# Patient Record
Sex: Female | Born: 1985 | Race: White | Hispanic: Yes | Marital: Married | Smoking: Never smoker
Health system: Southern US, Community
[De-identification: ages and names within clinical notes are randomized; demographics above are authoritative.]

## PROBLEM LIST (undated history)

## (undated) ENCOUNTER — Inpatient Hospital Stay (HOSPITAL_COMMUNITY): Payer: Self-pay

## (undated) DIAGNOSIS — F329 Major depressive disorder, single episode, unspecified: Secondary | ICD-10-CM

## (undated) DIAGNOSIS — F32A Depression, unspecified: Secondary | ICD-10-CM

## (undated) DIAGNOSIS — R87629 Unspecified abnormal cytological findings in specimens from vagina: Secondary | ICD-10-CM

## (undated) DIAGNOSIS — IMO0002 Reserved for concepts with insufficient information to code with codable children: Secondary | ICD-10-CM

## (undated) DIAGNOSIS — R87619 Unspecified abnormal cytological findings in specimens from cervix uteri: Secondary | ICD-10-CM

## (undated) HISTORY — DX: Depression, unspecified: F32.A

## (undated) HISTORY — PX: NO PAST SURGERIES: SHX2092

## (undated) HISTORY — DX: Reserved for concepts with insufficient information to code with codable children: IMO0002

## (undated) HISTORY — DX: Unspecified abnormal cytological findings in specimens from vagina: R87.629

## (undated) HISTORY — PX: COLPOSCOPY: SHX161

## (undated) HISTORY — DX: Major depressive disorder, single episode, unspecified: F32.9

## (undated) HISTORY — DX: Unspecified abnormal cytological findings in specimens from cervix uteri: R87.619

---
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
## 2007-12-31 ENCOUNTER — Emergency Department (HOSPITAL_COMMUNITY): Admission: EM | Admit: 2007-12-31 | Discharge: 2007-12-31 | Payer: Self-pay | Admitting: Family Medicine

## 2008-03-10 ENCOUNTER — Inpatient Hospital Stay (HOSPITAL_COMMUNITY): Admission: AD | Admit: 2008-03-10 | Discharge: 2008-03-10 | Payer: Self-pay | Admitting: Obstetrics & Gynecology

## 2008-05-07 ENCOUNTER — Ambulatory Visit (HOSPITAL_COMMUNITY): Admission: RE | Admit: 2008-05-07 | Discharge: 2008-05-07 | Payer: Self-pay | Admitting: Obstetrics & Gynecology

## 2008-10-10 ENCOUNTER — Ambulatory Visit (HOSPITAL_COMMUNITY): Admission: RE | Admit: 2008-10-10 | Discharge: 2008-10-10 | Payer: Self-pay | Admitting: Family Medicine

## 2008-10-11 ENCOUNTER — Inpatient Hospital Stay (HOSPITAL_COMMUNITY): Admission: AD | Admit: 2008-10-11 | Discharge: 2008-10-11 | Payer: Self-pay | Admitting: Obstetrics and Gynecology

## 2008-10-11 ENCOUNTER — Inpatient Hospital Stay (HOSPITAL_COMMUNITY): Admission: AD | Admit: 2008-10-11 | Discharge: 2008-10-13 | Payer: Self-pay | Admitting: Obstetrics and Gynecology

## 2008-10-11 ENCOUNTER — Ambulatory Visit: Payer: Self-pay | Admitting: Obstetrics and Gynecology

## 2010-06-21 ENCOUNTER — Emergency Department (HOSPITAL_COMMUNITY): Admission: EM | Admit: 2010-06-21 | Discharge: 2010-06-21 | Payer: Self-pay | Admitting: Emergency Medicine

## 2011-01-28 LAB — URINALYSIS, ROUTINE W REFLEX MICROSCOPIC
Glucose, UA: NEGATIVE mg/dL
Ketones, ur: NEGATIVE mg/dL
Protein, ur: NEGATIVE mg/dL
Specific Gravity, Urine: 1.015 (ref 1.005–1.030)
pH: 8 (ref 5.0–8.0)

## 2011-01-28 LAB — URINE MICROSCOPIC-ADD ON

## 2011-01-28 LAB — POCT I-STAT, CHEM 8
Chloride: 106 mEq/L (ref 96–112)
Glucose, Bld: 89 mg/dL (ref 70–99)
HCT: 39 % (ref 36.0–46.0)
Hemoglobin: 13.3 g/dL (ref 12.0–15.0)
Sodium: 140 mEq/L (ref 135–145)

## 2011-08-09 LAB — URINE MICROSCOPIC-ADD ON

## 2011-08-09 LAB — WET PREP, GENITAL: Trich, Wet Prep: NONE SEEN

## 2011-08-09 LAB — POCT PREGNANCY, URINE: Operator id: 222261

## 2011-08-09 LAB — URINALYSIS, ROUTINE W REFLEX MICROSCOPIC
Bilirubin Urine: NEGATIVE
Ketones, ur: 15 — AB

## 2011-08-09 LAB — URINE CULTURE

## 2011-08-16 LAB — CBC
Hemoglobin: 12.6 g/dL (ref 12.0–15.0)
MCHC: 33.9 g/dL (ref 30.0–36.0)
RBC: 4.31 MIL/uL (ref 3.87–5.11)
WBC: 8.4 10*3/uL (ref 4.0–10.5)

## 2011-08-16 LAB — RPR: RPR Ser Ql: NONREACTIVE

## 2012-07-11 ENCOUNTER — Ambulatory Visit (INDEPENDENT_AMBULATORY_CARE_PROVIDER_SITE_OTHER): Payer: Self-pay | Admitting: Obstetrics and Gynecology

## 2012-07-11 ENCOUNTER — Encounter: Payer: Self-pay | Admitting: Obstetrics and Gynecology

## 2012-07-11 VITALS — BP 97/68 | HR 64 | Temp 98.3°F | Ht 60.0 in | Wt 119.4 lb

## 2012-07-11 DIAGNOSIS — R87613 High grade squamous intraepithelial lesion on cytologic smear of cervix (HGSIL): Secondary | ICD-10-CM | POA: Insufficient documentation

## 2012-07-11 NOTE — Progress Notes (Signed)
Patient ID: Anna Cowan, female   DOB: July 29, 1986, 26 y.o.   MRN: [id number] Patient referred from health department for further management of abnormal pap smear. 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 Patient counseled on the need to repeat pap smear with co-testing at 12 and 24 months vs excisional biopsy according to ASCCP guidelines. Patient opted for repeat pap smear. Patient very hesitant regarding excisional biopsy. Procedure explained including its risks and benefits. All questions were answered. Patient still desires to have a repeat pap smear.   Patient understands that it is important to follow-up for these appointments as to prevent progression to cervical cancer.

## 2013-04-02 LAB — OB RESULTS CONSOLE HEPATITIS B SURFACE ANTIGEN: Hepatitis B Surface Ag: NEGATIVE

## 2013-04-02 LAB — OB RESULTS CONSOLE HIV ANTIBODY (ROUTINE TESTING): HIV: NONREACTIVE

## 2013-04-02 LAB — OB RESULTS CONSOLE RPR: RPR: NONREACTIVE

## 2013-04-02 LAB — OB RESULTS CONSOLE GC/CHLAMYDIA
Chlamydia: NEGATIVE
Gonorrhea: NEGATIVE

## 2013-04-02 LAB — OB RESULTS CONSOLE RUBELLA ANTIBODY, IGM: Rubella: IMMUNE

## 2013-08-18 ENCOUNTER — Encounter (HOSPITAL_COMMUNITY): Payer: Self-pay | Admitting: *Deleted

## 2013-08-18 ENCOUNTER — Inpatient Hospital Stay (HOSPITAL_COMMUNITY)
Admission: AD | Admit: 2013-08-18 | Discharge: 2013-08-20 | DRG: 373 | Disposition: A | Discharge status: Home / Self Care | Payer: BC Managed Care – PPO | Source: Ambulatory Visit | Attending: Obstetrics and Gynecology | Admitting: Obstetrics and Gynecology

## 2013-08-18 LAB — TYPE AND SCREEN: Antibody Screen: NEGATIVE

## 2013-08-18 LAB — CBC
Hemoglobin: 11.1 g/dL — ABNORMAL LOW (ref 12.0–15.0)
MCH: 26.9 pg (ref 26.0–34.0)
MCHC: 33.3 g/dL (ref 30.0–36.0)
MCV: 80.8 fL (ref 78.0–100.0)
Platelets: 181 10*3/uL (ref 150–400)
RBC: 4.12 MIL/uL (ref 3.87–5.11)

## 2013-08-18 MED ORDER — OXYCODONE-ACETAMINOPHEN 5-325 MG PO TABS: 1.0000 | ORAL_TABLET | ORAL | Status: DC | PRN

## 2013-08-18 MED ORDER — IBUPROFEN 600 MG PO TABS: 600.0000 mg | ORAL_TABLET | Freq: Four times a day (QID) | ORAL | Status: DC | PRN

## 2013-08-18 MED ORDER — FLEET ENEMA 7-19 GM/118ML RE ENEM: 1.0000 | ENEMA | RECTAL | Status: DC | PRN

## 2013-08-18 MED ORDER — ACETAMINOPHEN 325 MG PO TABS: 650.0000 mg | ORAL_TABLET | ORAL | Status: DC | PRN

## 2013-08-18 MED ORDER — LIDOCAINE HCL (PF) 1 % IJ SOLN
30.0000 mL | INTRAMUSCULAR | Status: DC | PRN
  Filled 2013-08-18 (×2): qty 30

## 2013-08-18 MED ORDER — TERBUTALINE SULFATE 1 MG/ML IJ SOLN: 0.2500 mg | Freq: Once | INTRAMUSCULAR | Status: AC | PRN

## 2013-08-18 MED ORDER — LACTATED RINGERS IV SOLN
INTRAVENOUS | Status: DC
  Administered 2013-08-18 – 2013-08-19 (×3): via INTRAVENOUS

## 2013-08-18 MED ORDER — OXYTOCIN 40 UNITS IN LACTATED RINGERS INFUSION - SIMPLE MED
1.0000 m[IU]/min | INTRAVENOUS | Status: DC
  Administered 2013-08-18: 1 m[IU]/min via INTRAVENOUS
  Filled 2013-08-18: qty 1000

## 2013-08-18 MED ORDER — OXYTOCIN 40 UNITS IN LACTATED RINGERS INFUSION - SIMPLE MED
62.5000 mL/h | INTRAVENOUS | Status: DC
  Administered 2013-08-19: 62.5 mL/h via INTRAVENOUS

## 2013-08-18 MED ORDER — CITRIC ACID-SODIUM CITRATE 334-500 MG/5ML PO SOLN: 30.0000 mL | ORAL | Status: DC | PRN

## 2013-08-18 MED ORDER — BUTORPHANOL TARTRATE 1 MG/ML IJ SOLN: 1.0000 mg | INTRAMUSCULAR | Status: DC | PRN

## 2013-08-18 MED ORDER — LACTATED RINGERS IV SOLN
500.0000 mL | INTRAVENOUS | Status: DC | PRN
  Administered 2013-08-19: 500 mL via INTRAVENOUS

## 2013-08-18 MED ORDER — OXYTOCIN BOLUS FROM INFUSION
500.0000 mL | INTRAVENOUS | Status: DC
  Administered 2013-08-19: 500 mL via INTRAVENOUS

## 2013-08-18 MED ORDER — ONDANSETRON HCL 4 MG/2ML IJ SOLN: 4.0000 mg | Freq: Four times a day (QID) | INTRAMUSCULAR | Status: DC | PRN

## 2013-08-18 NOTE — MAU Note (Signed)
Pt presents with complaints of a large gush of fluid yellowish in color around 430pm today. Denies any vaginal bleeding or contractions. States baby is active.

## 2013-08-18 NOTE — MAU Provider Note (Signed)
HPI:  Ms. Anna Cowan is a 27 y.o. female; G3P2002 at [redacted]w[redacted]d who presents with possible ROM. She felt a large gush of clear fluid around 1630. She is GBS negative and feeling occasional contractions.   Objective:  GENERAL: Well-developed, well-nourished female in no acute distress.  HEENT: Normocephalic, atraumatic.   LUNGS: Effort normal HEART: Regular rate  SKIN: Warm, dry and without erythema PSYCH: Normal mood and affect  Filed Vitals:   08/18/13 1725  BP: 137/84  Pulse: 75  Temp: 98 F (36.7 C)  Resp: 18  Height: 5' (1.524 m)  Weight: 150 lb (68.04 kg)   Fetal Tracing: Baseline: 135 bpm Variability: + Accelerations: 15x15 Decelerations: none  Toco: Irregular pattern   Speculum exam: Vagina - Small amount of thick, mucous like discharge, no odor, small amount of clear fluid pooling in the vaginal canal Cervix - No contact bleeding Chaperone present for exam.  Dilation: 4 Effacement (%): 60 Cervical Position: Posterior Station: -2 Presentation: Vertex Exam by:: SBeck, RN/JLowe, RN  Fern slide positive  GBS negative  Admit to L/D per Dr. Bussey Blas, NP 08/18/2013 6:47 PM

## 2013-08-18 NOTE — MAU Note (Signed)
Was 1cm in office last Wednesday

## 2013-08-18 NOTE — MAU Note (Signed)
Pt states ?rom around 1630 today. Unsure if leaking fluid at present. Denies any issues with pregnancy.

## 2013-08-18 NOTE — H&P (Signed)
Anna Cowan is a 27 y.o. female presenting for ROM about 4:30 pm. No HA, no vision change, no epigastric pain. Maternal Medical History:  Reason for admission: Rupture of membranes.   Fetal activity: Perceived fetal activity is normal.      OB History   Grav Para Term Preterm Abortions TAB SAB Ect Mult Living   3 2 2       2      Past Medical History  Diagnosis Date  . Abnormal Pap smear    Past Surgical History  Procedure Laterality Date  . No past surgeries     Family History: family history is not on file. Social History:  reports that she has never smoked. She has never used smokeless tobacco. She reports that she does not drink alcohol or use illicit drugs.   Prenatal Transfer Tool  Maternal Diabetes: No Genetic Screening: Normal Maternal Ultrasounds/Referrals: Normal Fetal Ultrasounds or other Referrals:  None Maternal Substance Abuse:  No Significant Maternal Medications:  None Significant Maternal Lab Results:  None Other Comments:  None  Review of Systems  Eyes: Negative for blurred vision.  Gastrointestinal: Negative for abdominal pain.  Neurological: Negative for headaches.    Dilation: 4 Effacement (%): 60 Station: -2 Exam by:: SBeck, RN/JLowe, RN Blood pressure 137/84, pulse 75, temperature 98 F (36.7 C), resp. rate 18, height 5' (1.524 m), weight 150 lb (68.04 kg), last menstrual period 11/20/2012. Maternal Exam:  Uterine Assessment: Contraction strength is mild.  Contraction frequency is irregular.      Fetal Exam Fetal State Assessment: Category I - tracings are normal.     Physical Exam  Cardiovascular: Normal rate and regular rhythm.   Respiratory: Effort normal and breath sounds normal.  GI: Soft. There is no tenderness.  Neurological: She has normal reflexes.    Prenatal labs: ABO, Rh:   Antibody:   Rubella:   RPR:    HBsAg:    HIV:    GBS:     Assessment/Plan: 27 yo G3P2 at 92 5/7 weeks with ROM D/W patient, all  question answered   Anna Cowan,Anna Cowan 08/18/2013, 7:15 PM

## 2013-08-18 NOTE — Progress Notes (Signed)
Notified pt in MAU with SROM at 1630, clear fluid, GBS negative, cervix 4/60/-2, vertex. Dr. Henderson Cloud to place orders.

## 2013-08-19 ENCOUNTER — Encounter (HOSPITAL_COMMUNITY): Payer: Self-pay | Admitting: Emergency Medicine

## 2013-08-19 LAB — RPR: RPR Ser Ql: NONREACTIVE

## 2013-08-19 MED ORDER — WITCH HAZEL-GLYCERIN EX PADS: 1.0000 "application " | MEDICATED_PAD | CUTANEOUS | Status: DC | PRN

## 2013-08-19 MED ORDER — TETANUS-DIPHTH-ACELL PERTUSSIS 5-2.5-18.5 LF-MCG/0.5 IM SUSP: 0.5000 mL | Freq: Once | INTRAMUSCULAR | Status: DC

## 2013-08-19 MED ORDER — FLEET ENEMA 7-19 GM/118ML RE ENEM: 1.0000 | ENEMA | Freq: Every day | RECTAL | Status: DC | PRN

## 2013-08-19 MED ORDER — ONDANSETRON HCL 4 MG PO TABS: 4.0000 mg | ORAL_TABLET | ORAL | Status: DC | PRN

## 2013-08-19 MED ORDER — SENNOSIDES-DOCUSATE SODIUM 8.6-50 MG PO TABS
2.0000 | ORAL_TABLET | ORAL | Status: DC
  Administered 2013-08-20: 2 via ORAL

## 2013-08-19 MED ORDER — OXYCODONE-ACETAMINOPHEN 5-325 MG PO TABS: 1.0000 | ORAL_TABLET | ORAL | Status: DC | PRN

## 2013-08-19 MED ORDER — ZOLPIDEM TARTRATE 5 MG PO TABS: 5.0000 mg | ORAL_TABLET | Freq: Every evening | ORAL | Status: DC | PRN

## 2013-08-19 MED ORDER — BISACODYL 10 MG RE SUPP: 10.0000 mg | Freq: Every day | RECTAL | Status: DC | PRN

## 2013-08-19 MED ORDER — ONDANSETRON HCL 4 MG/2ML IJ SOLN: 4.0000 mg | INTRAMUSCULAR | Status: DC | PRN

## 2013-08-19 MED ORDER — BENZOCAINE-MENTHOL 20-0.5 % EX AERO
1.0000 "application " | INHALATION_SPRAY | CUTANEOUS | Status: DC | PRN
  Administered 2013-08-19: 1 via TOPICAL
  Filled 2013-08-19: qty 56

## 2013-08-19 MED ORDER — DIBUCAINE 1 % RE OINT: 1.0000 "application " | TOPICAL_OINTMENT | RECTAL | Status: DC | PRN

## 2013-08-19 MED ORDER — IBUPROFEN 600 MG PO TABS
600.0000 mg | ORAL_TABLET | Freq: Four times a day (QID) | ORAL | Status: DC
  Administered 2013-08-19 – 2013-08-20 (×4): 600 mg via ORAL
  Filled 2013-08-19 (×4): qty 1

## 2013-08-19 MED ORDER — LANOLIN HYDROUS EX OINT: TOPICAL_OINTMENT | CUTANEOUS | Status: DC | PRN

## 2013-08-19 MED ORDER — SIMETHICONE 80 MG PO CHEW: 80.0000 mg | CHEWABLE_TABLET | ORAL | Status: DC | PRN

## 2013-08-19 MED ORDER — PRENATAL MULTIVITAMIN CH
1.0000 | ORAL_TABLET | Freq: Every day | ORAL | Status: DC
  Administered 2013-08-19: 1 via ORAL
  Filled 2013-08-19: qty 1

## 2013-08-19 MED ORDER — DIPHENHYDRAMINE HCL 25 MG PO CAPS: 25.0000 mg | ORAL_CAPSULE | Freq: Four times a day (QID) | ORAL | Status: DC | PRN

## 2013-08-19 NOTE — Progress Notes (Signed)
Delivery Note At 6:52 AM a viable female was delivered via  (Presentation: ;  ).  APGAR:9 ,9 ; weight .   Placenta status:intact , .  Cord:3 vessels  with the following complications: .  Cord pH: none  Anesthesia: None  Episiotomy: none  Lacerations: none Suture Repair: none Est. Blood Loss (mL): 300  Mom to postpartum.  Baby to nursery-stable.  Rajesh Wyss II,Sundeep Destin E 08/19/2013, 6:59 AM

## 2013-08-19 NOTE — Progress Notes (Signed)
CTSP: re question of position Cx 4/70/-1/vtx, sutures felt AROM of forebag for small amt of clear fluid FHT reactive UCs q 3-4 min, moderate

## 2013-08-20 LAB — CBC
HCT: 30.1 % — ABNORMAL LOW (ref 36.0–46.0)
MCH: 27.3 pg (ref 26.0–34.0)
Platelets: 142 10*3/uL — ABNORMAL LOW (ref 150–400)
RBC: 3.7 MIL/uL — ABNORMAL LOW (ref 3.87–5.11)
WBC: 9.4 10*3/uL (ref 4.0–10.5)

## 2013-08-20 MED ORDER — PRENATAL MULTIVITAMIN CH: 1.0000 | ORAL_TABLET | Freq: Every day | ORAL | Status: DC

## 2013-08-20 MED ORDER — IBUPROFEN 600 MG PO TABS: 600.0000 mg | ORAL_TABLET | Freq: Four times a day (QID) | ORAL | Status: DC

## 2013-08-20 NOTE — Discharge Summary (Signed)
Obstetric Discharge Summary Reason for Admission: rupture of membranes Prenatal Procedures: ultrasound Intrapartum Procedures: spontaneous vaginal delivery Postpartum Procedures: none Complications-Operative and Postpartum: none Hemoglobin  Date Value Range Status  08/20/2013 10.1* 12.0 - 15.0 g/dL Final     HCT  Date Value Range Status  08/20/2013 30.1* 36.0 - 46.0 % Final    Physical Exam:  General: alert and cooperative Lochia: appropriate Uterine Fundus: firm Incision: perineum intact DVT Evaluation: No evidence of DVT seen on physical exam. Negative Homan's sign. No cords or calf tenderness. No significant calf/ankle edema.  Discharge Diagnoses: Term Pregnancy-delivered  Discharge Information: Date: 08/20/2013 Activity: pelvic rest Diet: routine Medications: PNV and Ibuprofen Condition: stable Instructions: refer to practice specific booklet Discharge to: home   Newborn Data: Live born female  Birth Weight: 8 lb 10.8 oz (3935 g) APGAR: 9, 9  Home with mother.  Moris Ratchford G 08/20/2013, 8:29 AM

## 2013-08-20 NOTE — Progress Notes (Signed)
Post Partum Day 1 Subjective: no complaints, up ad lib, voiding and tolerating PO  Objective: Blood pressure 116/67, pulse 61, temperature 97.6 F (36.4 C), temperature source Oral, resp. rate 16, height 5' (1.524 m), weight 150 lb (68.04 kg), last menstrual period 11/20/2012, SpO2 100.00%, unknown if currently breastfeeding.  Physical Exam:  General: alert and cooperative Lochia: appropriate Uterine Fundus: firm Incision: perineum intact DVT Evaluation: No evidence of DVT seen on physical exam. Negative Homan's sign. No cords or calf tenderness. No significant calf/ankle edema.   Recent Labs  08/18/13 1845 08/20/13 0615  HGB 11.1* 10.1*  HCT 33.3* 30.1*    Assessment/Plan: Discharge home   LOS: 2 days   Janaia Kozel G 08/20/2013, 8:22 AM

## 2014-09-15 ENCOUNTER — Encounter (HOSPITAL_COMMUNITY): Payer: Self-pay | Admitting: Emergency Medicine

## 2014-11-14 NOTE — L&D Delivery Note (Signed)
Patient is 29 y.o. [id number] 8035w4d admitted in active labor. Labor progressed well without augmentation. I was called to the room urgently for crowning. The patient pushed effectively to a large crown which was persistent despite pushing effort. The asked for a stool to be at bedside given length of time the infant crowned. Upon delivery of the chin there was turtling at the perineum and I identified a shoulder dystocia. The RN team helped with Mcroberts and Suprapubic pressure. I performed a reverse wood screw maneuver and was able to hook under the anterior shoulder in the axilla. The right shoulder was anterior. With this sling under the axilla the dystocia was alleviated. Total time was <30 seconds. Upon delivery the infant was stunned and the cord was cut and infant was brought to the warmer.   Delivery Note At 6:30 AM a viable female was delivered via Vaginal, Spontaneous Delivery (Presentation: Right Occiput Anterior).  APGAR: 7, 9; weight-pending. Nuchal cord noted and delivered through.    Placenta status: Intact, Spontaneous.  Cord: 3 vessels with the following complications: None.  Cord pH: not collected  Anesthesia: None  Episiotomy: None Lacerations: None Suture Repair: na Est. Blood Loss (mL):  300mL  Mom to postpartum.  Baby to Couplet care / Skin to Skin.  Anna Cowan [facility] 10/25/2015, 7:21 AM

## 2014-12-01 ENCOUNTER — Encounter (HOSPITAL_COMMUNITY): Payer: Self-pay | Admitting: Neurology

## 2014-12-01 ENCOUNTER — Emergency Department (HOSPITAL_COMMUNITY)
Admission: EM | Admit: 2014-12-01 | Discharge: 2014-12-01 | Disposition: A | Discharge status: Home / Self Care | Payer: BLUE CROSS/BLUE SHIELD | Attending: Emergency Medicine | Admitting: Emergency Medicine

## 2014-12-01 ENCOUNTER — Emergency Department (HOSPITAL_COMMUNITY): Payer: BLUE CROSS/BLUE SHIELD

## 2014-12-01 DIAGNOSIS — Z79899 Other long term (current) drug therapy: Secondary | ICD-10-CM | POA: Diagnosis not present

## 2014-12-01 DIAGNOSIS — S6992XA Unspecified injury of left wrist, hand and finger(s), initial encounter: Secondary | ICD-10-CM | POA: Diagnosis present

## 2014-12-01 DIAGNOSIS — Y998 Other external cause status: Secondary | ICD-10-CM | POA: Diagnosis not present

## 2014-12-01 DIAGNOSIS — Y92330 Ice skating rink (indoor) (outdoor) as the place of occurrence of the external cause: Secondary | ICD-10-CM | POA: Diagnosis not present

## 2014-12-01 DIAGNOSIS — Y9321 Activity, ice skating: Secondary | ICD-10-CM | POA: Insufficient documentation

## 2014-12-01 DIAGNOSIS — S62617A Displaced fracture of proximal phalanx of left little finger, initial encounter for closed fracture: Secondary | ICD-10-CM | POA: Diagnosis not present

## 2014-12-01 MED ORDER — ACETAMINOPHEN 325 MG PO TABS
650.0000 mg | ORAL_TABLET | Freq: Once | ORAL | Status: AC
  Administered 2014-12-01: 650 mg via ORAL
  Filled 2014-12-01: qty 2

## 2014-12-01 NOTE — ED Notes (Signed)
Ortho made aware need for splint.

## 2014-12-01 NOTE — Discharge Instructions (Signed)
Please follow the directions provided. Be sure to follow up with Dr. Janee Mornhompson (hand doctor) for further management of your broken bone in your hand.  They should call you but if you don't hear from them, you can use the phone number provided to arrange a follow-up appointment.  You may use tylenol or ibuprofen for pain.  Don't hesitate to return for new, worsening or concerning symptoms.     SEEK MEDICAL CARE IF:  You have pain or swelling that limits the motion or use of your fingers.  SEEK IMMEDIATE MEDICAL CARE IF:  Your finger becomes numb.

## 2014-12-01 NOTE — ED Notes (Signed)
Pt reports falling on her left hand last night while ice skating. Swelling to left pinky finger. Sensation intact.

## 2014-12-01 NOTE — ED Provider Notes (Signed)
CSN: [id number]     Arrival date & time 12/01/14  0735 History   First MD Initiated Contact with Patient 12/01/14 [phone]     Chief Complaint  Patient presents with  . Hand Injury   (Consider location/radiation/quality/duration/timing/severity/associated sxs/prior Treatment) Patient is a 29 y.o. female presenting with hand injury.  Hand Injury Associated symptoms: no fever    Anna Cowan is a 29 yo presenting with pain and swelling to left fifth finger after falling while ice skating last night.  She doesn't recall her position when she fell and denies significant pain at the time of the fall.  She reports noticing some swelling last night but worsening swelling and decreased range of motion this morning. She currently describes the pain as aching and rates as 3/10 and nonradiating. She denies alteration in sensation, color or temperature.   Past Medical History  Diagnosis Date  . Abnormal Pap smear    Past Surgical History  Procedure Laterality Date  . No past surgeries     No family history on file. History  Substance Use Topics  . Smoking status: Never Smoker   . Smokeless tobacco: Never Used  . Alcohol Use: No   OB History    Gravida Para Term Preterm AB TAB SAB Ectopic Multiple Living   3 3 3       3      Review of Systems  Constitutional: Negative for fever.  Respiratory: Negative for shortness of breath.   Cardiovascular: Negative for chest pain.  Musculoskeletal: Positive for myalgias and arthralgias.  Neurological: Negative for weakness and numbness.    Allergies  Review of patient's allergies indicates no known allergies.  Home Medications   Prior to Admission medications   Medication Sig Start Date End Date Taking? Authorizing Provider  ibuprofen (ADVIL,MOTRIN) 600 MG tablet Take 1 tablet (600 mg total) by mouth every 6 (six) hours. 08/20/13  Yes Judith Blonderarol G Curtis, NP  Prenatal Vit-Fe Fumarate-FA (PRENATAL MULTIVITAMIN) TABS tablet Take 1 tablet by mouth daily  at 12 noon. 08/20/13  Yes Judith Blonderarol G Curtis, NP   BP 114/58 mmHg  Pulse 72  Temp(Src) 97.2 F (36.2 C)  Resp 18  Ht 5' (1.524 m)  Wt 117 lb (53.071 kg)  BMI 22.85 kg/m2  SpO2 100% Physical Exam  Constitutional: She appears well-developed and well-nourished. No distress.  HENT:  Head: Normocephalic and atraumatic.  Mouth/Throat: Oropharynx is clear and moist. No oropharyngeal exudate.  Eyes: Conjunctivae are normal.  Neck: Neck supple. No thyromegaly present.  Cardiovascular: Normal rate, regular rhythm and intact distal pulses.   Pulmonary/Chest: Effort normal and breath sounds normal. No respiratory distress. She has no wheezes. She has no rales. She exhibits no tenderness.  Abdominal: Soft. There is no tenderness.  Musculoskeletal:       Left hand: She exhibits decreased range of motion, tenderness, bony tenderness and swelling. She exhibits normal two-point discrimination and normal capillary refill. Normal sensation noted. Normal strength noted.       Hands: Lymphadenopathy:    She has no cervical adenopathy.  Neurological: She is alert.  Skin: Skin is warm and dry. No rash noted. She is not diaphoretic.  Psychiatric: She has a normal mood and affect.  Nursing note and vitals reviewed.   ED Course  Procedures (including critical care time) Labs Review Labs Reviewed - No data to display  Imaging Review Dg Hand Complete Left  12/01/2014   CLINICAL DATA:  Patient fell while skating 1 day prior. Pain  medially  EXAM: LEFT HAND - COMPLETE 3+ VIEW  COMPARISON:  None.  FINDINGS: Frontal, oblique, and lateral views were obtained. There is a fracture of the distal aspect of the fifth proximal phalanx with volar angulation distally. There is soft tissue swelling in this area. No other fracture. No dislocation. Joint spaces appear intact.  IMPRESSION: Fracture distal aspect fifth proximal phalanx with volar angulation distally.   Electronically Signed   By: Bretta Bang M.D.   On:  12/01/2014 08:10     EKG Interpretation None      MDM   Final diagnoses:  Fracture of fifth finger, proximal phalanx, left, closed, initial encounter   29 yo with fractured distal aspect fifth proximal phalanx seen on xray.  Discussed case with Dr. Jodi Mourning. Pt defers pain meds while in ED.  Consulted Dr. Janee Morn (hand surgery), will apply ulnar gutter splint and have pt follow-up in the office. Conservative therapy recommended and discussed. Patient will be dc home & is agreeable with above plan.   Filed Vitals:   12/01/14 0742 12/01/14 0830 12/01/14 0900 12/01/14 0930  BP: 114/58 100/61 103/65 105/64  Pulse: 72 79 67 67  Temp: 97.2 F (36.2 C)     Resp: 18     Height: 5' (1.524 m)     Weight: 117 lb (53.071 kg)     SpO2: 100% 100% 99% 99%   Meds given in ED:  Medications  acetaminophen (TYLENOL) tablet 650 mg (650 mg Oral Given 12/01/14 0912)    Discharge Medication List as of 12/01/2014  9:31 AM         Harle Battiest, NP 12/02/14 0124  Enid Skeens, MD 12/02/14 [phone]

## 2014-12-01 NOTE — Progress Notes (Signed)
Orthopedic Tech Progress Note Patient Details:  Irena CordsOliva A Bontrager 05/11/1986 [id number]  Ortho Devices Type of Ortho Device: Ace wrap, Ulna gutter splint Ortho Device/Splint Location: lue Ortho Device/Splint Interventions: Application   Loyda Costin 12/01/2014, 9:40 AM

## 2014-12-01 NOTE — ED Notes (Signed)
Patient transported to X-ray 

## 2015-03-23 ENCOUNTER — Other Ambulatory Visit (HOSPITAL_COMMUNITY): Payer: Self-pay | Admitting: Physician Assistant

## 2015-03-23 DIAGNOSIS — Z3682 Encounter for antenatal screening for nuchal translucency: Secondary | ICD-10-CM

## 2015-03-23 LAB — OB RESULTS CONSOLE RUBELLA ANTIBODY, IGM: RUBELLA: IMMUNE

## 2015-03-23 LAB — OB RESULTS CONSOLE HIV ANTIBODY (ROUTINE TESTING): HIV: NONREACTIVE

## 2015-03-23 LAB — OB RESULTS CONSOLE HEPATITIS B SURFACE ANTIGEN: Hepatitis B Surface Ag: NEGATIVE

## 2015-03-23 LAB — OB RESULTS CONSOLE ABO/RH: RH Type: POSITIVE

## 2015-03-23 LAB — OB RESULTS CONSOLE RPR: RPR: NONREACTIVE

## 2015-03-23 LAB — OB RESULTS CONSOLE GC/CHLAMYDIA
CHLAMYDIA, DNA PROBE: NEGATIVE
GC PROBE AMP, GENITAL: NEGATIVE

## 2015-03-23 LAB — OB RESULTS CONSOLE ANTIBODY SCREEN: ANTIBODY SCREEN: NEGATIVE

## 2015-04-15 ENCOUNTER — Other Ambulatory Visit (HOSPITAL_COMMUNITY): Payer: BLUE CROSS/BLUE SHIELD

## 2015-04-15 ENCOUNTER — Ambulatory Visit (HOSPITAL_COMMUNITY)
Admission: RE | Admit: 2015-04-15 | Discharge: 2015-04-15 | Disposition: A | Discharge status: Home / Self Care | Payer: BLUE CROSS/BLUE SHIELD | Source: Ambulatory Visit | Attending: Physician Assistant | Admitting: Physician Assistant

## 2015-05-25 ENCOUNTER — Other Ambulatory Visit (HOSPITAL_COMMUNITY): Payer: Self-pay | Admitting: Nurse Practitioner

## 2015-05-25 DIAGNOSIS — Z3689 Encounter for other specified antenatal screening: Secondary | ICD-10-CM

## 2015-05-27 ENCOUNTER — Ambulatory Visit (HOSPITAL_COMMUNITY)
Admission: RE | Admit: 2015-05-27 | Discharge: 2015-05-27 | Disposition: A | Discharge status: Home / Self Care | Payer: BLUE CROSS/BLUE SHIELD | Source: Ambulatory Visit | Attending: Nurse Practitioner | Admitting: Nurse Practitioner

## 2015-05-27 ENCOUNTER — Encounter (HOSPITAL_COMMUNITY): Payer: Self-pay

## 2015-05-27 DIAGNOSIS — Z3689 Encounter for other specified antenatal screening: Secondary | ICD-10-CM | POA: Insufficient documentation

## 2015-05-27 DIAGNOSIS — Z36 Encounter for antenatal screening of mother: Secondary | ICD-10-CM | POA: Insufficient documentation

## 2015-05-27 DIAGNOSIS — Z3A19 19 weeks gestation of pregnancy: Secondary | ICD-10-CM | POA: Insufficient documentation

## 2015-09-11 ENCOUNTER — Inpatient Hospital Stay (HOSPITAL_COMMUNITY)
Admission: AD | Admit: 2015-09-11 | Discharge: 2015-09-11 | Disposition: A | Discharge status: Home / Self Care | Payer: Self-pay | Source: Ambulatory Visit | Attending: Obstetrics and Gynecology | Admitting: Obstetrics and Gynecology

## 2015-09-11 ENCOUNTER — Encounter (HOSPITAL_COMMUNITY): Payer: Self-pay | Admitting: *Deleted

## 2015-09-11 DIAGNOSIS — O4703 False labor before 37 completed weeks of gestation, third trimester: Secondary | ICD-10-CM

## 2015-09-11 DIAGNOSIS — N898 Other specified noninflammatory disorders of vagina: Secondary | ICD-10-CM | POA: Insufficient documentation

## 2015-09-11 DIAGNOSIS — Z3A34 34 weeks gestation of pregnancy: Secondary | ICD-10-CM | POA: Insufficient documentation

## 2015-09-11 LAB — URINALYSIS, ROUTINE W REFLEX MICROSCOPIC
Bilirubin Urine: NEGATIVE
GLUCOSE, UA: NEGATIVE mg/dL
KETONES UR: NEGATIVE mg/dL
Leukocytes, UA: NEGATIVE
Nitrite: NEGATIVE
PROTEIN: NEGATIVE mg/dL
Specific Gravity, Urine: 1.01 (ref 1.005–1.030)
Urobilinogen, UA: 0.2 mg/dL (ref 0.0–1.0)
pH: 5 (ref 5.0–8.0)

## 2015-09-11 LAB — POCT FERN TEST: POCT Fern Test: NEGATIVE

## 2015-09-11 LAB — URINE MICROSCOPIC-ADD ON

## 2015-09-11 LAB — AMNISURE RUPTURE OF MEMBRANE (ROM) NOT AT ARMC: Amnisure ROM: NEGATIVE

## 2015-09-11 NOTE — Discharge Instructions (Signed)
Informacin sobre el parto prematuro  (Preterm Labor Information) El parto prematuro comienza antes de la semana 37 de embarazo. La duracin de un embarazo normal es de 39 a 41 semanas.  CAUSAS  Generalmente no hay una causa que pueda identificarse del motivo por el que una mujer comienza un trabajo de parto prematuro. Sin embargo, una de las causas conocidas ms frecuentes son las infecciones. Las infecciones del tero, el cuello, la vagina, el lquido amnitico, la vejiga, los riones y hasta de los pulmones (neumona) pueden hacer que el trabajo de parto se inicie. Otras causas que pueden sospecharse son:   Infecciones urogenitales, como infecciones por hongos y vaginosis bacteriana.   Anormalidades uterinas (forma del tero, sptum uterino, fibromas, hemorragias en la placenta).   Un cuello que ha sido operado (puede ser que no permanezca cerrado).   Malformaciones del feto.   Gestaciones mltiples (mellizos, trillizos y ms).   Ruptura del saco amnitico.  FACTORES DE RIESGO   Historia previa de parto prematuro.   Tener ruptura prematura de las membranas (RPM).   La placenta cubre la abertura del cuello (placenta previa).   La placenta se separa del tero (abrupcin placentaria).   El cuello es demasiado dbil para contener al beb en el tero (cuello incompetente).   Hay mucho lquido en el saco amnitico (polihidramnios).   Consumo de drogas o hbito de fumar durante el embarazo.   No aumentar de peso lo suficiente durante el embarazo.   Mujeres menores de 18 aos o mayores de 35 aos.   Nivel socioeconmico bajo.   Pertenecer a la raza afroamericana. SNTOMAS  Los signos y sntomas del trabajo de parto prematuro son:   Clicos similares a los menstruales, dolor abdominal o dolor de espalda.  Contracciones uterinas regulares, tan frecuentes como seis por hora, sin importar su intensidad (pueden ser suaves o dolorosas).  Contracciones que comienzan  en la parte superior del tero y se expanden hacia abajo, a la zona inferior del abdomen y la espalda.   Sensacin de aumento de presin en la pelvis.   Aparece una secrecin acuosa o sanguinolenta por la vagina.  TRATAMIENTO  Segn el tiempo del embarazo y otras circunstancias, el mdico puede indicar reposo en cama. Si es necesario, le indicarn medicamentos para detener las contracciones y para madurar los pulmones del feto. Si el trabajo de parto se inicia antes de las 34 semanas de embarazo, se recomienda la hospitalizacin. El tratamiento depende de las condiciones en que se encuentren usted y el feto.  QU DEBE HACER SI PIENSA QUE EST EN TRABAJO DE PARTO PREMATURO?  Comunquese con su mdico inmediatamente. Debe concurrir al hospital para ser controlada inmediatamente.  CMO PUEDE EVITAR EL TRABAJO DE PARTO PREMATURO EN FUTUROS EMBARAZOS?  Usted debe:   Si fuma, abandonar el hbito.  Mantener un peso saludable y evitar sustancias qumicas y drogas innecesarias.  Controlar todo tipo de infeccin.  Informe a su mdico si tiene una historia conocida de trabajo de parto prematuro.   Esta informacin no tiene como fin reemplazar el consejo del mdico. Asegrese de hacerle al mdico cualquier pregunta que tenga.   Document Released: 02/07/2008 Document Revised: 07/03/2013 Elsevier Interactive Patient Education 2016 [employer]  

## 2015-09-11 NOTE — MAU Provider Note (Signed)
  History     CSN: [id number]  Arrival date and time: 09/11/15 1817   None     Chief Complaint  Patient presents with  . Rupture of Membranes   HPI  Anna Cowan 29 y.o. [id number] 5429w2d presents to MAU stating she is contracting and thinks her water broke. Denies vaginal bleeding.  Past Medical History  Diagnosis Date  . Abnormal Pap smear     Past Surgical History  Procedure Laterality Date  . No past surgeries      History reviewed. No pertinent family history.  Social History  Substance Use Topics  . Smoking status: Never Smoker   . Smokeless tobacco: Never Used  . Alcohol Use: No    Allergies: No Known Allergies  Prescriptions prior to admission  Medication Sig Dispense Refill Last Dose  . ibuprofen (ADVIL,MOTRIN) 600 MG tablet Take 1 tablet (600 mg total) by mouth every 6 (six) hours. 30 tablet 1 Past Month at Unknown time  . Prenatal Vit-Fe Fumarate-FA (PRENATAL MULTIVITAMIN) TABS tablet Take 1 tablet by mouth daily at 12 noon. 30 tablet 4 12/01/2014 at Unknown time    Review of Systems  Constitutional: Negative for fever.  Gastrointestinal: Positive for abdominal pain.  Genitourinary:       Clear fluid from vagina  All other systems reviewed and are negative.  Physical Exam   Blood pressure 117/73, pulse 79, temperature 98.2 F (36.8 C), temperature source Oral, resp. rate 18, last menstrual period 01/14/2015, unknown if currently breastfeeding.  Physical Exam  Nursing note and vitals reviewed. Constitutional: She is oriented to person, place, and time. She appears well-developed and well-nourished. No distress.  HENT:  Head: Normocephalic and atraumatic.  Neck: Normal range of motion.  Cardiovascular: Normal rate and regular rhythm.   Respiratory: Effort normal. No respiratory distress.  GI: Soft. She exhibits no distension.  Genitourinary: Vagina normal. Cervix exhibits discharge.  Thin fluid noted at cervical opening; questionable pooling.    Neurological: She is alert and oriented to person, place, and time.  Skin: Skin is warm and dry.  Psychiatric: She has a normal mood and affect. Her behavior is normal. Judgment and thought content normal.  Dilation: Fingertip Effacement (%): 50 Exam by:: clemmons   MAU Course  Procedures  MDM Spec Exam- Amnisure done Results for orders placed or performed during the hospital encounter of 09/11/15 (from the past 24 hour(s))  [facility]     Status: None   Collection Time: 09/11/15  7:10 PM  Result Value Ref Range   Amnisure ROM NEGATIVE    FHR- reactive Cat 1 Mild contractions q 4-5 minutes  Assessment and Plan  Preterm Contractions Discharge to home  Clemmons,Lori Grissett 09/11/2015, 7:14 PM

## 2015-09-11 NOTE — MAU Note (Signed)
Pt states her water broke @ 1724, had large gush of clear fluid.  C/O very mild uc's, denies bleeding.

## 2015-09-11 NOTE — MAU Provider Note (Signed)
S: RN called CNM to room to report pt concerned that her water is broken even though amnisure test was negative.  Pt had SROM with previous pregnancies and believes this is the same.  She was standing up at home and felt a gush and had a puddle of fluid on the floor.  She has not required a pad since the episode of leakage.   O: BP 117/73 mmHg  Pulse 79  Temp(Src) 98.2 F (36.8 C) (Oral)  Resp 18  LMP 01/14/2015   SSE with negative pooling with valsalva, negative ferning on slide  A: Vaginal discharge  P: D/c home with PTL precautions No evidence of SROM at this time F/U as scheduled in office  LEFTWICH-KIRBY, LISA, CNM 8:06 PM

## 2015-10-22 ENCOUNTER — Encounter (HOSPITAL_COMMUNITY): Payer: Self-pay | Admitting: *Deleted

## 2015-10-22 ENCOUNTER — Telehealth (HOSPITAL_COMMUNITY): Payer: Self-pay | Admitting: *Deleted

## 2015-10-22 LAB — OB RESULTS CONSOLE GBS: STREP GROUP B AG: NEGATIVE

## 2015-10-22 NOTE — Telephone Encounter (Signed)
Preadmission screen Interpreter number [phone]

## 2015-10-23 ENCOUNTER — Ambulatory Visit (HOSPITAL_COMMUNITY)
Admission: RE | Admit: 2015-10-23 | Discharge: 2015-10-23 | Disposition: A | Discharge status: Home / Self Care | Payer: Self-pay | Source: Ambulatory Visit | Attending: Medical | Admitting: Medical

## 2015-10-23 ENCOUNTER — Ambulatory Visit (HOSPITAL_COMMUNITY): Payer: Self-pay

## 2015-10-23 ENCOUNTER — Other Ambulatory Visit (HOSPITAL_COMMUNITY): Payer: Self-pay | Admitting: Nurse Practitioner

## 2015-10-23 DIAGNOSIS — O480 Post-term pregnancy: Secondary | ICD-10-CM

## 2015-10-23 DIAGNOSIS — IMO0001 Reserved for inherently not codable concepts without codable children: Secondary | ICD-10-CM

## 2015-10-23 DIAGNOSIS — Z3A40 40 weeks gestation of pregnancy: Secondary | ICD-10-CM | POA: Insufficient documentation

## 2015-10-25 ENCOUNTER — Inpatient Hospital Stay (HOSPITAL_COMMUNITY)
Admission: AD | Admit: 2015-10-25 | Discharge: 2015-10-26 | DRG: 775 | Disposition: A | Discharge status: Home / Self Care | Payer: Medicaid Other | Source: Ambulatory Visit | Attending: Obstetrics & Gynecology | Admitting: Obstetrics & Gynecology

## 2015-10-25 ENCOUNTER — Encounter (HOSPITAL_COMMUNITY): Payer: Self-pay

## 2015-10-25 DIAGNOSIS — O480 Post-term pregnancy: Secondary | ICD-10-CM | POA: Diagnosis present

## 2015-10-25 DIAGNOSIS — Z818 Family history of other mental and behavioral disorders: Secondary | ICD-10-CM

## 2015-10-25 DIAGNOSIS — IMO0001 Reserved for inherently not codable concepts without codable children: Secondary | ICD-10-CM

## 2015-10-25 LAB — TYPE AND SCREEN
ABO/RH(D): O POS
ANTIBODY SCREEN: NEGATIVE

## 2015-10-25 LAB — CBC
HCT: 33.2 % — ABNORMAL LOW (ref 36.0–46.0)
Hemoglobin: 11 g/dL — ABNORMAL LOW (ref 12.0–15.0)
MCH: 27 pg (ref 26.0–34.0)
MCHC: 33.1 g/dL (ref 30.0–36.0)
MCV: 81.6 fL (ref 78.0–100.0)
PLATELETS: 177 10*3/uL (ref 150–400)
RBC: 4.07 MIL/uL (ref 3.87–5.11)
RDW: 14.4 % (ref 11.5–15.5)
WBC: 7.6 10*3/uL (ref 4.0–10.5)

## 2015-10-25 LAB — RPR: RPR Ser Ql: NONREACTIVE

## 2015-10-25 MED ORDER — ONDANSETRON HCL 4 MG/2ML IJ SOLN: 4.0000 mg | Freq: Four times a day (QID) | INTRAMUSCULAR | Status: DC | PRN

## 2015-10-25 MED ORDER — OXYCODONE-ACETAMINOPHEN 5-325 MG PO TABS: 1.0000 | ORAL_TABLET | ORAL | Status: DC | PRN

## 2015-10-25 MED ORDER — CITRIC ACID-SODIUM CITRATE 334-500 MG/5ML PO SOLN: 30.0000 mL | ORAL | Status: DC | PRN

## 2015-10-25 MED ORDER — SIMETHICONE 80 MG PO CHEW
80.0000 mg | CHEWABLE_TABLET | ORAL | Status: DC | PRN
  Filled 2015-10-25: qty 1

## 2015-10-25 MED ORDER — LANOLIN HYDROUS EX OINT: TOPICAL_OINTMENT | CUTANEOUS | Status: DC | PRN

## 2015-10-25 MED ORDER — DIBUCAINE 1 % RE OINT: 1.0000 "application " | TOPICAL_OINTMENT | RECTAL | Status: DC | PRN

## 2015-10-25 MED ORDER — OXYTOCIN BOLUS FROM INFUSION: 500.0000 mL | INTRAVENOUS | Status: DC

## 2015-10-25 MED ORDER — FLEET ENEMA 7-19 GM/118ML RE ENEM: 1.0000 | ENEMA | Freq: Every day | RECTAL | Status: DC | PRN

## 2015-10-25 MED ORDER — BENZOCAINE-MENTHOL 20-0.5 % EX AERO: 1.0000 "application " | INHALATION_SPRAY | CUTANEOUS | Status: DC | PRN

## 2015-10-25 MED ORDER — ACETAMINOPHEN 325 MG PO TABS: 650.0000 mg | ORAL_TABLET | ORAL | Status: DC | PRN

## 2015-10-25 MED ORDER — LACTATED RINGERS IV SOLN: INTRAVENOUS | Status: DC

## 2015-10-25 MED ORDER — LACTATED RINGERS IV SOLN: 500.0000 mL | INTRAVENOUS | Status: DC | PRN

## 2015-10-25 MED ORDER — LIDOCAINE HCL (PF) 1 % IJ SOLN
30.0000 mL | INTRAMUSCULAR | Status: DC | PRN
Start: 2015-10-25 — End: 2015-10-25
  Filled 2015-10-25: qty 30

## 2015-10-25 MED ORDER — WITCH HAZEL-GLYCERIN EX PADS: 1.0000 "application " | MEDICATED_PAD | CUTANEOUS | Status: DC | PRN

## 2015-10-25 MED ORDER — OXYTOCIN 40 UNITS IN LACTATED RINGERS INFUSION - SIMPLE MED: 1.0000 m[IU]/min | INTRAVENOUS | Status: DC

## 2015-10-25 MED ORDER — OXYCODONE-ACETAMINOPHEN 5-325 MG PO TABS: 2.0000 | ORAL_TABLET | ORAL | Status: DC | PRN

## 2015-10-25 MED ORDER — FENTANYL CITRATE (PF) 100 MCG/2ML IJ SOLN: 50.0000 ug | INTRAMUSCULAR | Status: DC | PRN

## 2015-10-25 MED ORDER — OXYTOCIN 40 UNITS IN LACTATED RINGERS INFUSION - SIMPLE MED: 62.5000 mL/h | INTRAVENOUS | Status: DC

## 2015-10-25 MED ORDER — PRENATAL MULTIVITAMIN CH
1.0000 | ORAL_TABLET | Freq: Every day | ORAL | Status: DC
  Administered 2015-10-25: 1 via ORAL
  Filled 2015-10-25: qty 1

## 2015-10-25 MED ORDER — TERBUTALINE SULFATE 1 MG/ML IJ SOLN: 0.2500 mg | Freq: Once | INTRAMUSCULAR | Status: DC | PRN

## 2015-10-25 MED ORDER — LACTATED RINGERS IV SOLN
INTRAVENOUS | Status: DC
  Administered 2015-10-25: 03:00:00 via INTRAVENOUS

## 2015-10-25 MED ORDER — ONDANSETRON HCL 4 MG PO TABS: 4.0000 mg | ORAL_TABLET | ORAL | Status: DC | PRN

## 2015-10-25 MED ORDER — OXYTOCIN 40 UNITS IN LACTATED RINGERS INFUSION - SIMPLE MED: 62.5000 mL/h | INTRAVENOUS | Status: DC | PRN

## 2015-10-25 MED ORDER — MISOPROSTOL 25 MCG QUARTER TABLET: 25.0000 ug | ORAL_TABLET | ORAL | Status: DC | PRN

## 2015-10-25 MED ORDER — OXYTOCIN 40 UNITS IN LACTATED RINGERS INFUSION - SIMPLE MED
62.5000 mL/h | INTRAVENOUS | Status: DC
  Administered 2015-10-25: 500 mL/h via INTRAVENOUS
  Filled 2015-10-25: qty 1000

## 2015-10-25 MED ORDER — ZOLPIDEM TARTRATE 5 MG PO TABS: 5.0000 mg | ORAL_TABLET | Freq: Every evening | ORAL | Status: DC | PRN

## 2015-10-25 MED ORDER — DOCUSATE SODIUM 100 MG PO CAPS
100.0000 mg | ORAL_CAPSULE | Freq: Two times a day (BID) | ORAL | Status: DC
  Administered 2015-10-25: 100 mg via ORAL
  Filled 2015-10-25: qty 1

## 2015-10-25 MED ORDER — ONDANSETRON HCL 4 MG/2ML IJ SOLN: 4.0000 mg | INTRAMUSCULAR | Status: DC | PRN

## 2015-10-25 MED ORDER — IBUPROFEN 600 MG PO TABS
600.0000 mg | ORAL_TABLET | Freq: Four times a day (QID) | ORAL | Status: DC
  Administered 2015-10-25 – 2015-10-26 (×4): 600 mg via ORAL
  Filled 2015-10-25 (×4): qty 1

## 2015-10-25 MED ORDER — DIPHENHYDRAMINE HCL 25 MG PO CAPS: 25.0000 mg | ORAL_CAPSULE | Freq: Four times a day (QID) | ORAL | Status: DC | PRN

## 2015-10-25 NOTE — H&P (Signed)
LABOR ADMISSION HISTORY AND PHYSICAL  Anna Cowan is a 29 y.o. female [id number] with IUP at 7550w4d presenting for active labor. She reports +FM, + contractions, No LOF, no VB, no blurry vision, headaches or peripheral edema, and RUQ pain.  She plans on breast feeding. She request Nexplanon for birth control.  Dating: By [place] and LMP --->  Estimated Date of Delivery: 10/21/15  Sono:    @[redacted]w[redacted]d , CWD, normal anatomy, Cephalic presentation,  267 g, 44% EFW   Prenatal History/Complications: Hx of Postpartum Depression  LSIL, mild dysplasia, needs colposcopy following pregnancy   Past Medical History: Past Medical History  Diagnosis Date  . Abnormal Pap smear   . Depression     PP depression after del 2014 [facility]  . Vaginal Pap smear, abnormal     Past Surgical History: Past Surgical History  Procedure Laterality Date  . No past surgeries    . Colposcopy      Obstetrical History: OB History    Gravida Para Term Preterm AB TAB SAB Ectopic Multiple Living   4 3 3       5       Social History: Social History   Social History  . Marital Status: Married    Spouse Name: N/A  . Number of Children: N/A  . Years of Education: N/A   Social History Main Topics  . Smoking status: Never Smoker   . Smokeless tobacco: Never Used  . Alcohol Use: No  . Drug Use: No  . Sexual Activity: Not Currently   Other Topics Concern  . Not on file   Social History Narrative    Family History: Family History  Problem Relation Age of Onset  . Depression Sister     Allergies: No Known Allergies  Prescriptions prior to admission  Medication Sig Dispense Refill Last Dose  . Prenatal Vit-Fe Fumarate-FA (PRENATAL MULTIVITAMIN) TABS tablet Take 1 tablet by mouth daily at 12 noon. (Patient not taking: Reported on 09/11/2015) 30 tablet 4 12/01/2014 at Unknown time     Review of Systems   All systems reviewed and negative except as stated in  HPI  LMP 01/14/2015 General appearance: alert and cooperative Lungs: clear to auscultation bilaterally Heart: regular rate and rhythm Abdomen: soft, non-tender; bowel sounds normal Extremities: Homans sign is negative, no sign of DVT, edema Presentation: cephalic Fetal monitoringBaseline: 140 bpm, Variability: Good {> 6 bpm), Accelerations: Reactive and Decelerations: Absent Uterine activity, every 3 minutes.  Dilation: 5 Effacement (%): 90 Station: -2 Exam by:: Remigio EisenmengerBenji Stanley RN   Prenatal labs: ABO, Rh: O/Positive/-- (05/09 0000) Antibody: Negative (05/09 0000) Rubella: !Error! RPR: Nonreactive (05/09 0000)  HBsAg: Negative (05/09 0000)  HIV: Non-reactive (05/09 0000)  GBS: Negative (12/08 0000)  1 hr Glucola 99 Genetic screening  None  Anatomy US Normal   Prenatal Transfer Tool  Maternal Diabetes: No Genetic Screening: Normal Maternal Ultrasounds/Referrals: Normal Fetal Ultrasounds or other Referrals:  None Maternal Substance Abuse:  No Significant Maternal Medications:  None Significant Maternal Lab Results: None  No results found for this or any previous visit (from the past 24 hour(s)).  Patient Active Problem List   Diagnosis Date Noted  . Encounter for fetal anatomic survey   . [redacted] weeks gestation of pregnancy   . HGSIL (high grade squamous intraepithelial lesion) on Pap smear of cervix 07/11/2012    Assessment: Anna Cowan is a 29 y.o. G4P3005 at 1450w4d here for active labor   #  Labor: Expectant management  #Pain: Nothing,  #FWB: Category 1  #ID:  GBS negative  #MOF:  Breast #MOC: Nexplanon  #Circ:   Does not want circ   Noralee Chars, MD   I spoke with and examined patient and agree with resident/PA/SNM's note and plan of care.  Cheral Marker, CNM, [facility] 10/27/2015 8:53 AM

## 2015-10-25 NOTE — Lactation Note (Signed)
This note was copied from the chart of Anna Cowan. Lactation Consultation Note  Patient Name: Anna Nelida MeuseOliva Vangieson ZOXWR'UToday's Date: 10/25/2015 Reason for consult: Initial assessment Mom reports some mild nipple tenderness, no breakdown noted. Assisted Mom with positioning to obtain more depth with latch, demonstrating how to bring bottom lip down for wider gape at breast. Encouraged to BF with feeding ques, cluster feeding discussed. Lactation brochure left for review, advised of OP services and support group. Encouraged to call for assist as needed.   Maternal Data Has patient been taught Hand Expression?: Yes Does the patient have breastfeeding experience prior to this delivery?: Yes  Feeding Feeding Type: Breast Fed  LATCH Score/Interventions Latch: Grasps breast easily, tongue down, lips flanged, rhythmical sucking.  Audible Swallowing: A few with stimulation  Type of Nipple: Everted at rest and after stimulation  Comfort (Breast/Nipple): Filling, red/small blisters or bruises, mild/mod discomfort  Problem noted: Mild/Moderate discomfort Interventions (Mild/moderate discomfort): Hand massage;Hand expression  Hold (Positioning): Assistance needed to correctly position infant at breast and maintain latch. Intervention(s): Breastfeeding basics reviewed;Support Pillows;Position options;Skin to skin  LATCH Score: 7  Lactation Tools Discussed/Used     Consult Status Consult Status: Follow-up Date: 10/26/15 Follow-up type: In-patient    Alfred LevinsGranger, Michelle Wnek Ann 10/25/2015, 10:24 PM

## 2015-10-25 NOTE — MAU Note (Signed)
Contractions since 2400. Denies LOF or bleeding

## 2015-10-25 NOTE — Progress Notes (Signed)
CLINICAL SOCIAL WORK MATERNAL/CHILD NOTE  Patient Details  Name: Anna Cowan MRN: [id number] Date of Birth: 10/25/2014  Date: 10/25/2015  Clinical Social Worker Initiating Note: Tuck Dulworth, LCSWDate/ Time Initiated: 10/25/15/1528   Child's Name: Anna Cowan   Legal Guardian:  (Parents Anna Cowan and Anna Cowan)   Need for Interpreter: None   Date of Referral: 10/25/15   Reason for Referral: Other (Comment)   Referral Source: [facility]   Address: [address] [place], [place] [address]  Phone number:  [phone])   Household Members: Minor Children, Spouse   Natural Supports (not living in the home): Extended Family, Immediate Family   Professional Supports:None   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 Met with mother who was pleasant and receptive to social work. She is a married with 3 other dependents ages 23, 20 and 2. Spouse also present during CSW visit and was engaging. MOB reports hx of depression noting that she was treated with therapy about a year ago. MOB notes that she had mild PP Depression after the birth of her first child, but did not seek treatment and the symptoms eventually resolved. She denies any current symptoms of depression or anxiety. She denies any use of alcohol or illicit drug use during pregnancy. She reports having an excellent support system. No acute social concerns noted during [place] visit. Mother informed of social work Fish farm manager.  CSW  Plan/Description:    Discussed signs/symptoms of PP Depression and available resources No further intervention required No barriers to discharge   Kady Toothaker J, LCSW 10/25/2015, 3:33 PM

## 2015-10-26 MED ORDER — IBUPROFEN 600 MG PO TABS: 600.0000 mg | ORAL_TABLET | Freq: Four times a day (QID) | ORAL | Status: DC

## 2015-10-26 NOTE — Discharge Summary (Signed)
OB Discharge Summary  Patient Name: Anna Cowan DOB: [birth year] MRN: [id number]  Date of admission: 10/25/2015 Delivering MD: Lyndel Safe NILES   Date of discharge: 10/26/2015  Admitting diagnosis: 40wks,having contractions every apart Intrauterine pregnancy: [redacted]w[redacted]d     Secondary diagnosis:Principal Problem:   NSVD (normal spontaneous vaginal delivery) Active Problems:   Active labor at term   Shoulder dystocia during labor and delivery, delivered   Post term pregnancy, antepartum  Additional problems:none     Discharge diagnosis: Term Pregnancy Delivered                                                                     Post partum procedures:none  Augmentation: Pitocin  Complications: None  Hospital course:  Onset of Labor With Vaginal Delivery     29 y.o. yo [id number] at [redacted]w[redacted]d was admitted in Active Laboron 10/25/2015. Patient had an uncomplicated labor course as follows:  Membrane Rupture Time/Date: 5:16 AM ,10/25/2015   Intrapartum Procedures: Episiotomy: None [1]                                         Lacerations:  None [1]  Patient had a delivery of a Viable infant. 10/25/2015  Information for the patient's newborn:  Anna, Cowan [[id number]]  Delivery Method: Vaginal, Spontaneous Delivery (Filed from Delivery Summary)    Pateint had an uncomplicated postpartum course.  She is ambulating, tolerating a regular diet, passing flatus, and urinating well. Patient is discharged home in stable condition on No discharge date for patient encounter.Marland Kitchen    Physical exam  Filed Vitals:   10/25/15 0842 10/25/15 0945 10/25/15 1345 10/25/15 2140  BP: 133/73 123/66 126/79 108/56  Pulse: 61 59 65 71  Temp: 98.7 F (37.1 C) 98 F (36.7 C) 98 F (36.7 C) 98.1 F (36.7 C)  TempSrc: Oral Oral Oral Oral  Resp: Height:      Weight:      SpO2:    98%   General: alert, cooperative and no distress Lochia: appropriate Uterine Fundus:  firm Incision: N/A DVT Evaluation: Negative Homan's sign. No cords or calf tenderness. Labs: Lab Results  Component Value Date   WBC 7.6 10/25/2015   HGB 11.0* 10/25/2015   HCT 33.2* 10/25/2015   MCV 81.6 10/25/2015   PLT 177 10/25/2015   CMP Latest Ref Rng 06/21/2010  Glucose 70 - 99 mg/dL 89  BUN 6 - 23 mg/dL 11  Creatinine 0.4 - 1.2 mg/dL 0.8  Sodium 130 - 865 mEq/L 140  Potassium 3.5 - 5.1 mEq/L 3.8  Chloride 96 - 112 mEq/L 106    Discharge instruction: per After Visit Summary and "Baby and Me Booklet".  After Visit Meds:    Medication List    Notice    You have not been prescribed any medications.      Diet: routine diet  Activity: Advance as tolerated. Pelvic rest for 6 weeks.   Outpatient follow up:6 weeks Follow up Appt:Future Appointments Date Time Provider Department Center  10/29/2015 6:30 AM [facility] ROOM [facility] None   Follow up visit: No  Follow-up on file.  Postpartum contraception: Undecided  Newborn Data: Live born female  Birth Weight: 8 lb 14.2 oz (4030 g) APGAR: 7, 9  Baby Feeding: Bottle and Breast Disposition:home with mother   10/26/2015 Ferdie PingLAWSON, Anna DARLENE, CNM

## 2015-10-26 NOTE — Progress Notes (Signed)
UR chart review completed.  

## 2015-10-29 ENCOUNTER — Inpatient Hospital Stay (HOSPITAL_COMMUNITY): Admission: RE | Admit: 2015-10-29 | Payer: Self-pay | Source: Ambulatory Visit

## 2017-06-01 ENCOUNTER — Ambulatory Visit (INDEPENDENT_AMBULATORY_CARE_PROVIDER_SITE_OTHER): Payer: Self-pay

## 2017-06-01 ENCOUNTER — Ambulatory Visit (HOSPITAL_COMMUNITY)
Admission: EM | Admit: 2017-06-01 | Discharge: 2017-06-01 | Disposition: A | Discharge status: Home / Self Care | Payer: Self-pay | Attending: Internal Medicine | Admitting: Internal Medicine

## 2017-06-01 ENCOUNTER — Encounter (HOSPITAL_COMMUNITY): Payer: Self-pay | Admitting: Emergency Medicine

## 2017-06-01 DIAGNOSIS — K429 Umbilical hernia without obstruction or gangrene: Secondary | ICD-10-CM

## 2017-06-01 DIAGNOSIS — Z3202 Encounter for pregnancy test, result negative: Secondary | ICD-10-CM

## 2017-06-01 DIAGNOSIS — S301XXA Contusion of abdominal wall, initial encounter: Secondary | ICD-10-CM

## 2017-06-01 LAB — POCT PREGNANCY, URINE: Preg Test, Ur: NEGATIVE

## 2017-06-01 MED ORDER — NAPROXEN 500 MG PO TABS: 500.0000 mg | ORAL_TABLET | Freq: Two times a day (BID) | ORAL | 0 refills | Status: DC

## 2017-06-01 NOTE — ED Triage Notes (Signed)
Pt reports having a lump near her umbilicus for several years that has not bothered her until last night when her child accidentally hit it with his head.

## 2017-06-01 NOTE — Discharge Instructions (Addendum)
Hernia does not appear to be stuck or ruptured, but there is soreness of the abdominal wall consistent with a deep bruise.  Anticipate gradual improvement in soreness over the next 5-7 days.  Go to ED if new fever >100.5, persistent vomiting, severe/unrelenting (>1 hour) pain in abdomen.  Prescription for naproxen (anti inflammatory/pain reliever) sent to the pharmacy to take as needed for pain.  Ice for 5-10 minutes several times daily may help with soreness.

## 2017-06-01 NOTE — ED Provider Notes (Signed)
[facility]    CSN: [id number] Arrival date & time: 06/01/17  1003     History   Chief Complaint Chief Complaint  Patient presents with  . Hernia    HPI Anna Cowan is a 31 y.o. female. She gives history of long-standing umbilical hernia, soft, no symptoms. Her toddler, 60-year-old, about 20 pounds, head butted her last evening in the abdomen, and she has had soreness of the belly wall since. Wanted to have hernia checked. Little bit nauseous, no vomiting. Bowels moved this morning and were normal for her. Has an IUD, denies pregnancy. No urinary frequency or dysuria. No fever.    HPI  Past Medical History:  Diagnosis Date  . Abnormal Pap smear   . Depression    PP depression after del 2014 [facility]  . Vaginal Pap smear, abnormal     Patient Active Problem List   Diagnosis Date Noted  . Active labor at term 10/25/2015  . NSVD (normal spontaneous vaginal delivery) 10/25/2015  . Shoulder dystocia during labor and delivery, delivered 10/25/2015  . Post term pregnancy, antepartum 10/25/2015  . HGSIL (high grade squamous intraepithelial lesion) on Pap smear of cervix 07/11/2012    Past Surgical History:  Procedure Laterality Date  . COLPOSCOPY    . NO PAST SURGERIES      OB History    Gravida Para Term Preterm AB Living   4 4 4     4    SAB TAB Ectopic Multiple Live Births         0 4       Home Medications    Prior to Admission medications   Medication Sig Start Date End Date Taking? Authorizing Provider  ibuprofen (ADVIL,MOTRIN) 600 MG tablet Take 1 tablet (600 mg total) by mouth every 6 (six) hours. 10/26/15   Montez Morita, CNM  naproxen (NAPROSYN) 500 MG tablet Take 1 tablet (500 mg total) by mouth 2 (two) times daily. 06/01/17   Eustace Moore, MD    Family History Family History  Problem Relation Age of Onset  . Depression Sister     Social History Social History  Substance Use Topics  .  Smoking status: Never Smoker  . Smokeless tobacco: Never Used  . Alcohol use No     Allergies   Patient has no known allergies.   Review of Systems Review of Systems  All other systems reviewed and are negative.    Physical Exam Triage Vital Signs ED Triage Vitals  Enc Vitals Group     BP 06/01/17 1025 108/72     Pulse Rate 06/01/17 1025 67     Resp 06/01/17 1025 16     Temp 06/01/17 1025 99.1 F (37.3 C)     Temp Source 06/01/17 1025 Oral     SpO2 06/01/17 1025 99 %     Weight --      Height --      Pain Score 06/01/17 1026 4     Pain Loc --    Updated Vital Signs BP 108/72 (BP Location: Right Arm)   Pulse 67   Temp 99.1 F (37.3 C) (Oral)   Resp 16   LMP 04/19/2017 (Exact Date)   SpO2 99%   Physical Exam  Constitutional: She is oriented to person, place, and time. No distress.  HENT:  Head: Atraumatic.  Eyes:  Conjugate gaze observed, no eye redness/discharge  Neck: Neck supple.  Cardiovascular: Normal  rate and regular rhythm.   Pulmonary/Chest: No respiratory distress. She has no wheezes. She has no rales.  Lungs clear, symmetric breath sounds  Abdominal: Soft. She exhibits no distension. There is no rebound.  Diffuse mild tenderness to light palpation 1.5 inch umbilical hernia, mildly tender, but soft and reducible. No bruising, no erythema, no focal swelling. No rash.   Musculoskeletal: Normal range of motion.  Neurological: She is alert and oriented to person, place, and time.  Skin: Skin is warm and dry.  Nursing note and vitals reviewed.    UC Treatments / Results  Labs (all labs ordered are listed, but only abnormal results are displayed) Labs Reviewed  POCT PREGNANCY, URINE    Radiology Dg Abd Acute W/chest  Result Date: 06/01/2017 CLINICAL DATA:  Abdominal pain with low-grade fever EXAM: DG ABDOMEN ACUTE W/ 1V CHEST COMPARISON:  Chest radiograph June 21, 2010 FINDINGS: PA chest: Lungs are clear. Heart size and pulmonary vascularity  are normal. No adenopathy. Supine and upright abdomen: There is diffuse stool throughout the colon. There is no bowel dilatation or air-fluid level to suggest bowel obstruction. No free air. Intrauterine device present in mid pelvis. There is a small phlebolith in the left pelvis. IMPRESSION: Diffuse stool throughout colon. No bowel obstruction or free air. Intrauterine device in mid pelvis. Lungs clear. Electronically Signed   By: Bretta BangWilliam  Woodruff III M.D.   On: 06/01/2017 11:03    Procedures Procedures (including critical care time) None today  Final Clinical Impressions(s) / UC Diagnoses   Final diagnoses:  Contusion of abdominal wall, initial encounter  Umbilical hernia without obstruction and without gangrene   Hernia does not appear to be stuck or ruptured, but there is soreness of the abdominal wall consistent with a deep bruise.  Anticipate gradual improvement in soreness over the next 5-7 days.  Go to ED if new fever >100.5, persistent vomiting, severe/unrelenting (>1 hour) pain in abdomen.  Prescription for naproxen (anti inflammatory/pain reliever) sent to the pharmacy to take as needed for pain.  Ice for 5-10 minutes several times daily may help with soreness.  New Prescriptions New Prescriptions   NAPROXEN (NAPROSYN) 500 MG TABLET    Take 1 tablet (500 mg total) by mouth 2 (two) times daily.     Eustace MooreMurray, Juliann Olesky W, MD 06/06/17 2205

## 2018-07-04 ENCOUNTER — Inpatient Hospital Stay (HOSPITAL_COMMUNITY): Payer: Self-pay

## 2018-07-04 ENCOUNTER — Other Ambulatory Visit: Payer: Self-pay

## 2018-07-04 ENCOUNTER — Encounter (HOSPITAL_COMMUNITY): Payer: Self-pay | Admitting: Emergency Medicine

## 2018-07-04 ENCOUNTER — Ambulatory Visit (HOSPITAL_COMMUNITY)
Admission: EM | Admit: 2018-07-04 | Discharge: 2018-07-04 | Disposition: A | Discharge status: Home / Self Care | Payer: Self-pay | Attending: Family Medicine | Admitting: Family Medicine

## 2018-07-04 ENCOUNTER — Inpatient Hospital Stay (HOSPITAL_COMMUNITY)
Admission: AD | Admit: 2018-07-04 | Discharge: 2018-07-04 | Disposition: A | Discharge status: Home / Self Care | Payer: Self-pay | Source: Ambulatory Visit | Attending: Obstetrics and Gynecology | Admitting: Obstetrics and Gynecology

## 2018-07-04 DIAGNOSIS — R1030 Lower abdominal pain, unspecified: Secondary | ICD-10-CM

## 2018-07-04 DIAGNOSIS — R102 Pelvic and perineal pain: Secondary | ICD-10-CM | POA: Insufficient documentation

## 2018-07-04 DIAGNOSIS — N390 Urinary tract infection, site not specified: Secondary | ICD-10-CM | POA: Insufficient documentation

## 2018-07-04 DIAGNOSIS — R1032 Left lower quadrant pain: Secondary | ICD-10-CM | POA: Insufficient documentation

## 2018-07-04 DIAGNOSIS — R319 Hematuria, unspecified: Secondary | ICD-10-CM | POA: Insufficient documentation

## 2018-07-04 DIAGNOSIS — Z3202 Encounter for pregnancy test, result negative: Secondary | ICD-10-CM

## 2018-07-04 DIAGNOSIS — N3001 Acute cystitis with hematuria: Secondary | ICD-10-CM

## 2018-07-04 DIAGNOSIS — F329 Major depressive disorder, single episode, unspecified: Secondary | ICD-10-CM | POA: Insufficient documentation

## 2018-07-04 DIAGNOSIS — N939 Abnormal uterine and vaginal bleeding, unspecified: Secondary | ICD-10-CM

## 2018-07-04 DIAGNOSIS — N941 Unspecified dyspareunia: Secondary | ICD-10-CM

## 2018-07-04 LAB — CBC WITH DIFFERENTIAL/PLATELET
BASOS ABS: 0 10*3/uL (ref 0.0–0.1)
Basophils Relative: 0 %
Eosinophils Absolute: 0.1 10*3/uL (ref 0.0–0.7)
Eosinophils Relative: 1 %
HCT: 36.2 % (ref 36.0–46.0)
HEMOGLOBIN: 13 g/dL (ref 12.0–15.0)
LYMPHS ABS: 2.4 10*3/uL (ref 0.7–4.0)
LYMPHS PCT: 32 %
MCH: 32.3 pg (ref 26.0–34.0)
MCHC: 35.9 g/dL (ref 30.0–36.0)
MCV: 89.8 fL (ref 78.0–100.0)
Monocytes Absolute: 0.3 10*3/uL (ref 0.1–1.0)
Monocytes Relative: 4 %
Neutro Abs: 4.6 10*3/uL (ref 1.7–7.7)
Neutrophils Relative %: 63 %
PLATELETS: 151 10*3/uL (ref 150–400)
RBC: 4.03 MIL/uL (ref 3.87–5.11)
RDW: 12.3 % (ref 11.5–15.5)
WBC: 7.5 10*3/uL (ref 4.0–10.5)

## 2018-07-04 LAB — COMPREHENSIVE METABOLIC PANEL
ALK PHOS: 52 U/L (ref 38–126)
ALT: 14 U/L (ref 0–44)
AST: 18 U/L (ref 15–41)
Albumin: 4.5 g/dL (ref 3.5–5.0)
Anion gap: 9 (ref 5–15)
BUN: 13 mg/dL (ref 6–20)
CALCIUM: 8.9 mg/dL (ref 8.9–10.3)
CHLORIDE: 105 mmol/L (ref 98–111)
CO2: 21 mmol/L — AB (ref 22–32)
CREATININE: 0.77 mg/dL (ref 0.44–1.00)
Glucose, Bld: 91 mg/dL (ref 70–99)
Potassium: 3.7 mmol/L (ref 3.5–5.1)
Sodium: 135 mmol/L (ref 135–145)
Total Bilirubin: 0.9 mg/dL (ref 0.3–1.2)
Total Protein: 7.7 g/dL (ref 6.5–8.1)

## 2018-07-04 LAB — POCT PREGNANCY, URINE: Preg Test, Ur: NEGATIVE

## 2018-07-04 LAB — POCT URINALYSIS DIP (DEVICE)
Bilirubin Urine: NEGATIVE
GLUCOSE, UA: NEGATIVE mg/dL
Ketones, ur: NEGATIVE mg/dL
NITRITE: POSITIVE — AB
Protein, ur: >=300 mg/dL — AB
Specific Gravity, Urine: 1.02 (ref 1.005–1.030)
UROBILINOGEN UA: 0.2 mg/dL (ref 0.0–1.0)
pH: 7.5 (ref 5.0–8.0)

## 2018-07-04 MED ORDER — PHENAZOPYRIDINE HCL 100 MG PO TABS: 100.0000 mg | ORAL_TABLET | Freq: Three times a day (TID) | ORAL | 0 refills | Status: DC | PRN

## 2018-07-04 MED ORDER — SULFAMETHOXAZOLE-TRIMETHOPRIM 800-160 MG PO TABS: 1.0000 | ORAL_TABLET | Freq: Two times a day (BID) | ORAL | 1 refills | Status: DC

## 2018-07-04 NOTE — Discharge Instructions (Signed)

## 2018-07-04 NOTE — ED Triage Notes (Addendum)
Pelvic pain for 3 weeks, pain with urination started today.  Patient has been spotting for 2 weeks

## 2018-07-04 NOTE — Discharge Instructions (Addendum)
UPT negative in office Urine showed signs of infection.  Urine culture sent out Cervical swab obtained.  We will follow up with you regarding the results of your test Based on your symptoms and physical exam findings I am [facility] Patient aware and in agreement with this plan

## 2018-07-04 NOTE — MAU Note (Signed)
Pt seen at urgent care today. Told to come here for further evaluation. Pt reports pelvic pain and spotting x2-3 weeks. Pt has not taken anything for pain. Rates pain 2/10.

## 2018-07-04 NOTE — ED Provider Notes (Signed)
[facility]   [id number] 07/04/18 Arrival Time: 1859   CC: Pelvic discomfort  SUBJECTIVE:  Anna Cowan is a 32 y.o. female who presents with pelvic pain x 3 weeks.  Admits to having unprotected sex around the time of her symptoms.  Patient is sexually active with 1 female partner.  Not concerned for STDs. Complains of vaginal pain that is constant and 4/10.  She has NOT tried OTC medications.  She reports worsening symptoms with sitting or at rest.   She denies similar symptoms in the past. Complains of associated dyspareunia.  Patient also mentions vaginal bleeding that she describes as "spotting."  States symptoms are different from her usual vaginal bleeding following depo shots.   Also mentions dysuria, increased urinary frequency, increased urinary urgency, and bilateral flank pain that began today.    Also complains of LLQ pain that radiates toward the RLQ that began today.    She denies fever, chills, nausea, vomiting, vaginal itching, vaginal odor, vaginal rashes or lesions.   No LMP recorded. Patient has had an injection.   On depo shot.  Had spotting for first three months with depo.    ROS: As per HPI.  Past Medical History:  Diagnosis Date  . Abnormal Pap smear   . Depression    PP depression after del 2014 see at [facility]  . Vaginal Pap smear, abnormal    Past Surgical History:  Procedure Laterality Date  . COLPOSCOPY    . NO PAST SURGERIES     No Known Allergies No current facility-administered medications on file prior to encounter.    Current Outpatient Medications on File Prior to Encounter  Medication Sig Dispense Refill  . ibuprofen (ADVIL,MOTRIN) 600 MG tablet Take 1 tablet (600 mg total) by mouth every 6 (six) hours. 30 tablet 0  . naproxen (NAPROSYN) 500 MG tablet Take 1 tablet (500 mg total) by mouth 2 (two) times daily. 14 tablet 0    Social History   Socioeconomic History  . Marital status: Married   Spouse name: Not on file  . Number of children: Not on file  . Years of education: Not on file  . Highest education level: Not on file  Occupational History  . Not on file  Social Needs  . Financial resource strain: Not on file  . Food insecurity:    Worry: Not on file    Inability: Not on file  . Transportation needs:    Medical: Not on file    Non-medical: Not on file  Tobacco Use  . Smoking status: Never Smoker  . Smokeless tobacco: Never Used  Substance and Sexual Activity  . Alcohol use: No  . Drug use: No  . Sexual activity: Not Currently    Birth control/protection: Injection  Lifestyle  . Physical activity:    Days per week: Not on file    Minutes per session: Not on file  . Stress: Not on file  Relationships  . Social connections:    Talks on phone: Not on file    Gets together: Not on file    Attends religious service: Not on file    Active member of club or organization: Not on file    Attends meetings of clubs or organizations: Not on file    Relationship status: Not on file  . Intimate partner violence:    Fear of current or ex partner: Not on file    Emotionally abused: Not on file  Physically abused: Not on file    Forced sexual activity: Not on file  Other Topics Concern  . Not on file  Social History Narrative  . Not on file   Family History  Problem Relation Age of Onset  . Depression Sister     OBJECTIVE:  Vitals:   07/04/18 1915  BP: (!) 112/59  Pulse: 75  Resp: 16  Temp: 98 F (36.7 C)  TempSrc: Oral  SpO2: 99%     General appearance: alert, cooperative, appears uncomfortable Throat: lips, mucosa, and tongue normal; teeth and gums normal Lungs: CTA bilaterally without adventitious breath sounds Heart: regular rate and rhythm.  Radial pulses 2+ symmetrical bilaterally Back: + bilateral CVA tenderness Abdomen: soft, does not localize tenderness other than to bilateral flanks; bowel sounds normal; no masses or organomegaly; +  guarding  GU: Chaperone declined: external examination without vulvar lesions or erythema, blood present Bimanual exam: Negative for cervical motion, positive left-sided adenexal tenderness; Speculum exam: active bleeding from cervix and in vaginal vault.  Cervix visualized without erythema or lesions. Cervical swab obtained Skin: warm and dry Psychological:  Alert and cooperative. Normal mood and affect.  Results for orders placed or performed during the hospital encounter of 07/04/18  POCT urinalysis dip (device)  Result Value Ref Range   Glucose, UA NEGATIVE NEGATIVE mg/dL   Bilirubin Urine NEGATIVE NEGATIVE   Ketones, ur NEGATIVE NEGATIVE mg/dL   Specific Gravity, Urine 1.020 1.005 - 1.030   Hgb urine dipstick LARGE (A) NEGATIVE   pH 7.5 5.0 - 8.0   Protein, ur >=300 (A) NEGATIVE mg/dL   Urobilinogen, UA 0.2 0.0 - 1.0 mg/dL   Nitrite POSITIVE (A) NEGATIVE   Leukocytes, UA SMALL (A) NEGATIVE  Pregnancy, urine POC  Result Value Ref Range   Preg Test, Ur NEGATIVE NEGATIVE    Labs Reviewed  POCT URINALYSIS DIP (DEVICE) - Abnormal; Notable for the following components:      Result Value   Hgb urine dipstick LARGE (*)    Protein, ur >=300 (*)    Nitrite POSITIVE (*)    Leukocytes, UA SMALL (*)    All other components within normal limits  URINE CULTURE  POCT PREGNANCY, URINE  CERVICOVAGINAL ANCILLARY ONLY    MDM:  Patient presenting with pelvic pain, lower abdominal pain, and vaginal bleeding.  Exam significant for guarding, bilateral flank pain, vaginal bleeding, and left sided adnexal tenderness.  VS stable.  Patient appears uncomfortable, but not in acute distress.  Urine showed signs of infection.  UPT negative.  Based on presentation of symptoms and PE I am recommending further evaluation at [facility].  Cannot rule out ectopic pregnancy, PID, pyelonephritis, TOA,  in urgent care setting at this time.  Discussed this with the patient and she is in agreement with this  plan.  Discharged in stable condition.    ASSESSMENT & PLAN:  1. Pelvic pain in female   2. Lower abdominal pain   3. Vaginal bleeding   4. Adnexal tenderness, left   5. Urinary tract infection with hematuria, site unspecified     No orders of the defined types were placed in this encounter.   Pending: Labs Reviewed  POCT URINALYSIS DIP (DEVICE) - Abnormal; Notable for the following components:      Result Value   Hgb urine dipstick LARGE (*)    Protein, ur >=300 (*)    Nitrite POSITIVE (*)    Leukocytes, UA SMALL (*)    All other components within  normal limits  URINE CULTURE  POCT PREGNANCY, URINE  CERVICOVAGINAL ANCILLARY ONLY    UPT negative in office Urine showed signs of infection.  Urine culture sent out Cervical swab obtained.  We will follow up with you regarding the results of your test Based on your symptoms and physical exam findings I am [facility] Patient aware and in agreement with this plan  Reviewed expectations re: course of current medical issues. Questions answered. Outlined signs and symptoms indicating need for more acute intervention. Patient verbalized understanding. After Visit Summary given.       Rennis HardingWurst, Belissa Kooy, PA-C 07/04/18 2022

## 2018-07-04 NOTE — MAU Provider Note (Signed)
Chief Complaint:  Pelvic Pain   First Provider Initiated Contact with Patient 07/04/18 2155      HPI: Anna Cowan is a 32 y.o. [id number] who presents to maternity admissions reporting pain in suprapubic area and  Low back.  Pain is a little worse on the left.  .Does have some dysuria She reports vaginal bleeding, but no vaginal itching/burning, h/a, dizziness, n/v, or fever/chills.   Was seen at Urgent care and had a full pelvic exam but they sent her here to rule out Torsion and other etiologies for pain.  They stated she had a remarkable tenderness to pelvic exam but she states she feels better and is not having much pain   Pelvic Pain  The patient's primary symptoms include pelvic pain. The patient's pertinent negatives include no genital itching, genital lesions or vaginal bleeding. This is a new problem. The current episode started in the past 7 days. The problem occurs intermittently. The problem has been rapidly improving. The pain is mild. The problem affects both sides. She is not pregnant. Associated symptoms include abdominal pain and dysuria. Pertinent negatives include no chills, constipation, diarrhea, fever, flank pain, frequency, headaches, nausea or vomiting. Nothing aggravates the symptoms. She has tried nothing for the symptoms.   RN Note: Pt seen at urgent care today. Told to come here for further evaluation. Pt reports pelvic pain and spotting x2-3 weeks. Pt has not taken anything for pain. Rates pain 2/10  Past Medical History: Past Medical History:  Diagnosis Date  . Abnormal Pap smear   . Depression    PP depression after del 2014 see at [facility]  . Vaginal Pap smear, abnormal     Past obstetric history: OB History  Gravida Para Term Preterm AB Living  4 4 4     4   SAB TAB Ectopic Multiple Live Births        0 4    # Outcome Date GA Lbr Len/2nd Weight Sex Delivery Anes PTL Lv  4 Term 10/25/15 2637w4d 05:44 / 00:46 4030 g M  Vag-Spont None  LIV  3 Term 08/19/13 1844w6d 12:43 / 01:39 3935 g M Vag-Spont None  LIV  2 Term 2009 2612w0d  3685 g F Vag-Spont None  LIV  1 Term 2006 8612w0d  3062 g M Vag-Spont   LIV    Past Surgical History: Past Surgical History:  Procedure Laterality Date  . COLPOSCOPY    . NO PAST SURGERIES      Family History: Family History  Problem Relation Age of Onset  . Depression Sister     Social History: Social History   Tobacco Use  . Smoking status: Never Smoker  . Smokeless tobacco: Never Used  Substance Use Topics  . Alcohol use: No  . Drug use: No    Allergies: No Known Allergies  Meds:  Medications Prior to Admission  Medication Sig Dispense Refill Last Dose  . ibuprofen (ADVIL,MOTRIN) 600 MG tablet Take 1 tablet (600 mg total) by mouth every 6 (six) hours. 30 tablet 0 Unknown at Unknown time  . naproxen (NAPROSYN) 500 MG tablet Take 1 tablet (500 mg total) by mouth 2 (two) times daily. 14 tablet 0     I have reviewed patient's Past Medical Hx, Surgical Hx, Family Hx, Social Hx, medications and allergies.  ROS:  Review of Systems  Constitutional: Negative for chills and fever.  Gastrointestinal: Positive for abdominal pain. Negative for constipation, diarrhea, nausea and vomiting.  Genitourinary: Positive for dysuria and pelvic pain. Negative for flank pain and frequency.  Neurological: Negative for headaches.   Other systems negative     Physical Exam   Patient Vitals for the past 24 hrs:  BP Temp Pulse Resp SpO2 Height Weight  07/04/18 2052 110/60 98.9 F (37.2 C) 76 15 100 % 5' (1.524 m) 54.4 kg   Constitutional: Well-developed, well-nourished female in no acute distress.  Cardiovascular: normal rate and rhythm Respiratory: normal effort, no distress. Lungs CTAB with no wheezes or crackles GI: Abd soft, non-tender.  Nondistended.  No rebound, No guarding.   MS: Extremities nontender, no edema, normal ROM Neurologic: Alert and oriented x 4.   Grossly  nonfocal. GU: Neg CVAT bilaterally.  Does have low back pain but no flank tenderness Skin:  Warm and Dry Psych:  Affect appropriate.  PELVIC EXAM: not repeated   Labs: Results for orders placed or performed during the hospital encounter of 07/04/18 (from the past 24 hour(s))  CBC with Differential/Platelet     Status: None   Collection Time: 07/04/18  9:15 PM  Result Value Ref Range   WBC 7.5 4.0 - 10.5 K/uL   RBC 4.03 3.87 - 5.11 MIL/uL   Hemoglobin 13.0 12.0 - 15.0 g/dL   HCT 84.636.2 96.236.0 - 95.246.0 %   MCV 89.8 78.0 - 100.0 fL   MCH 32.3 26.0 - 34.0 pg   MCHC 35.9 30.0 - 36.0 g/dL   RDW 84.112.3 32.411.5 - 40.115.5 %   Platelets 151 150 - 400 K/uL   Neutrophils Relative % 63 %   Neutro Abs 4.6 1.7 - 7.7 K/uL   Lymphocytes Relative 32 %   Lymphs Abs 2.4 0.7 - 4.0 K/uL   Monocytes Relative 4 %   Monocytes Absolute 0.3 0.1 - 1.0 K/uL   Eosinophils Relative 1 %   Eosinophils Absolute 0.1 0.0 - 0.7 K/uL   Basophils Relative 0 %   Basophils Absolute 0.0 0.0 - 0.1 K/uL  Comprehensive metabolic panel     Status: Abnormal   Collection Time: 07/04/18  9:15 PM  Result Value Ref Range   Sodium 135 135 - 145 mmol/L   Potassium 3.7 3.5 - 5.1 mmol/L   Chloride 105 98 - 111 mmol/L   CO2 21 (L) 22 - 32 mmol/L   Glucose, Bld 91 70 - 99 mg/dL   BUN 13 6 - 20 mg/dL   Creatinine, Ser 0.270.77 0.44 - 1.00 mg/dL   Calcium 8.9 8.9 - 25.310.3 mg/dL   Total Protein 7.7 6.5 - 8.1 g/dL   Albumin 4.5 3.5 - 5.0 g/dL   AST 18 15 - 41 U/L   ALT 14 0 - 44 U/L   Alkaline Phosphatase 52 38 - 126 U/L   Total Bilirubin 0.9 0.3 - 1.2 mg/dL   GFR calc non Af Amer >60 >60 mL/min   GFR calc Af Amer >60 >60 mL/min   Anion gap 9 5 - 15      Imaging:  No results found.  MAU Course/MDM: I have ordered labs as follows: CBC to look for leukocytosis (negative) and CMET which was normal.  Imaging ordered: Pelvic US with dopplers to rule out torsion.  These studies were normal with good blood flow bilaterally Results reviewed.    Treatments in MAU included none.  THere is no CVAT.  No fever or leukocytosis.  Patient appears nontoxic  She appears comfortable.  Rates pain 2/10  Discussed this is likely a simple UTI.  Nitrites are positive. WIll culture urine and treat with Septra DS.   Pt stable at time of discharge.  Assessment: 1. Pelvic pain   2. Pelvic pain   3. Pelvic pain   4.     Urinary Tract Infection  Plan: Discharge home Recommend push po fluids and complete course of antibiotics Rx sent for Septra DS for UTI Rx Pyrdium for dysuria  Encouraged to return here or to other Urgent Care/ED if she develops worsening of symptoms, increase in pain, fever, or other concerning symptoms.   Wynelle Bourgeois CNM, MSN Certified Nurse-Midwife 07/04/2018 9:55 PM

## 2018-07-05 LAB — CERVICOVAGINAL ANCILLARY ONLY
Bacterial vaginitis: NEGATIVE
CANDIDA VAGINITIS: NEGATIVE
CHLAMYDIA, DNA PROBE: NEGATIVE
NEISSERIA GONORRHEA: NEGATIVE
TRICH (WINDOWPATH): NEGATIVE

## 2018-07-07 LAB — URINE CULTURE: Culture: >=100000 — AB

## 2019-05-20 IMAGING — US US ART/VEN ABD/PELV/SCROTUM DOPPLER LTD
1 series · 15 of 25 positions shown · non-contrast
Comparison: None.

CLINICAL DATA: Pelvic pain times 2-3 weeks.  Rule out torsion.

EXAM:
TRANSABDOMINAL ULTRASOUND OF PELVIS
DOPPLER ULTRASOUND OF OVARIES
TECHNIQUE: Transabdominal ultrasound examination of the pelvis was performed
including evaluation of the uterus, ovaries, adnexal regions, and
pelvic cul-de-sac.
Color and duplex Doppler ultrasound was utilized to evaluate blood
flow to the ovaries.

[Series 1: us art/ven abd/pelv/scrotum doppler ltd · 15 of 78 slices shown]
[im 1/78]
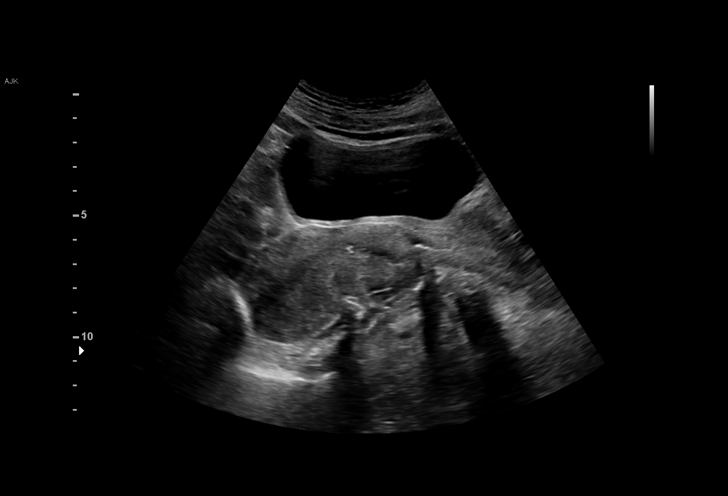
[im 7/78]
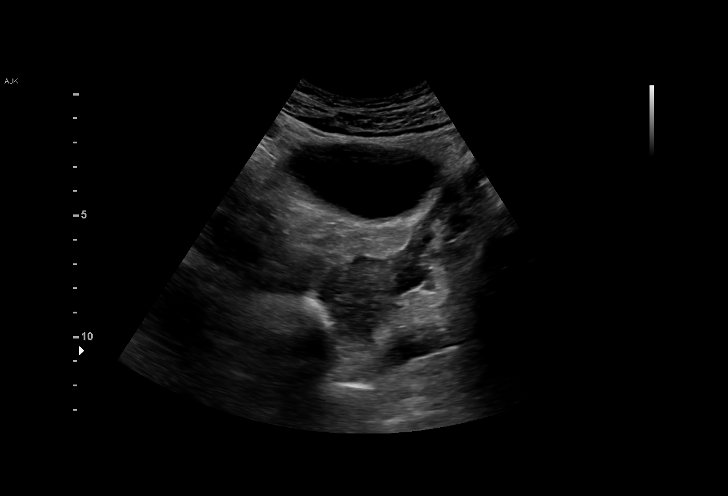
[im 13/78]
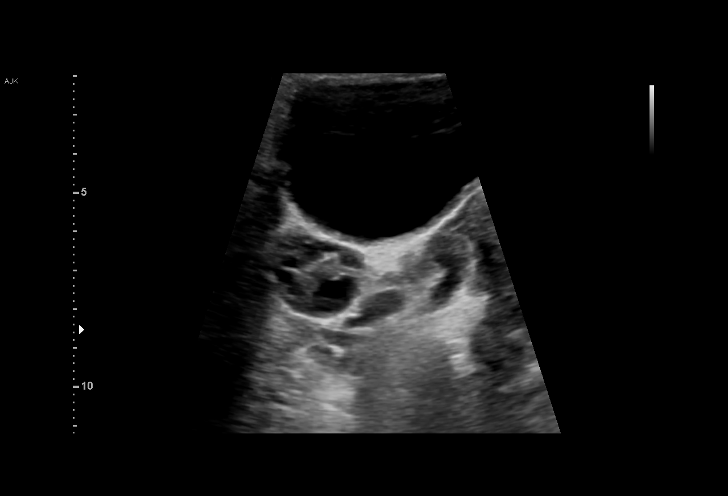
[im 17/78]
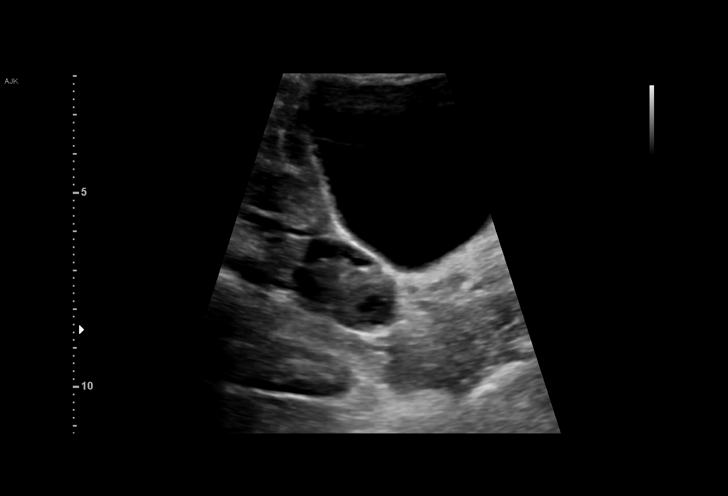
[im 23/78]
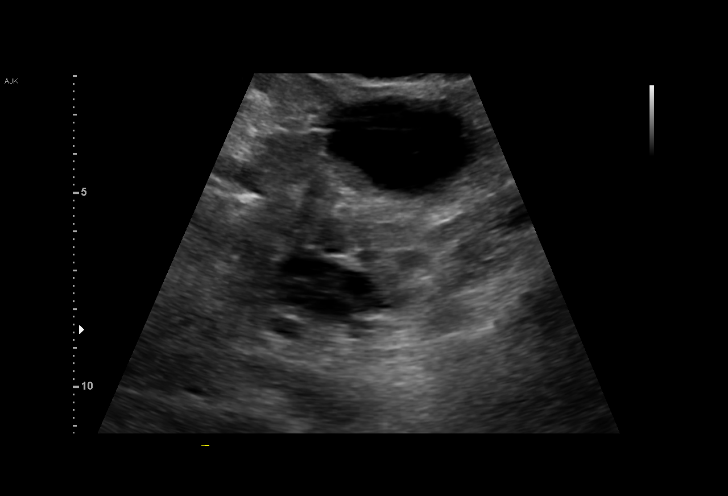
[im 29/78]
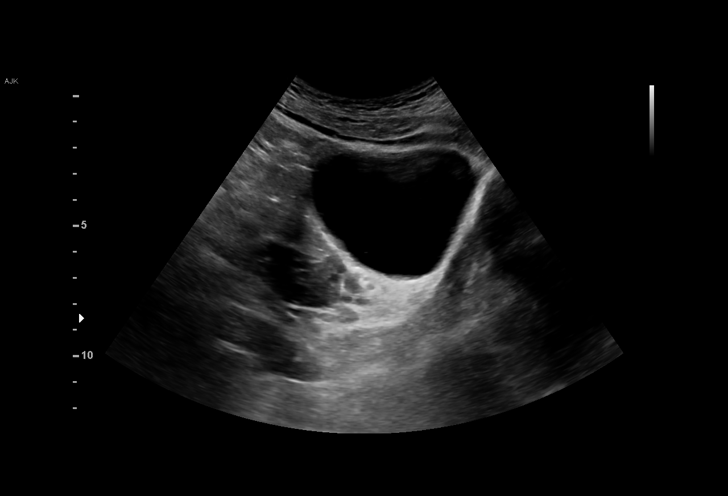
[im 33/78]
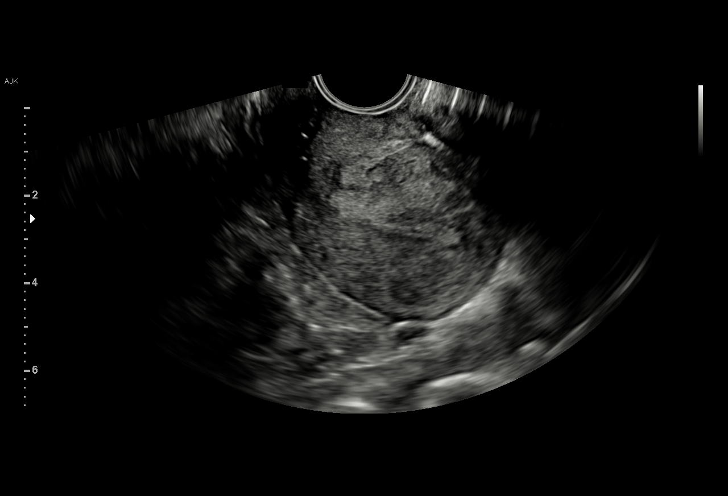
[im 39/78]
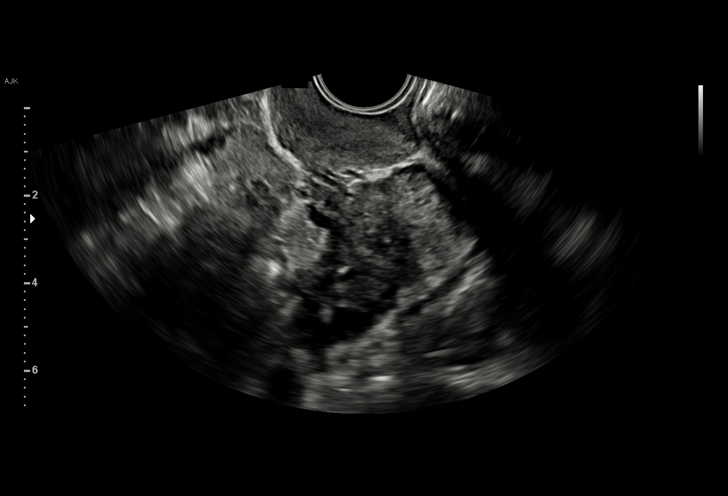
[im 45/78]
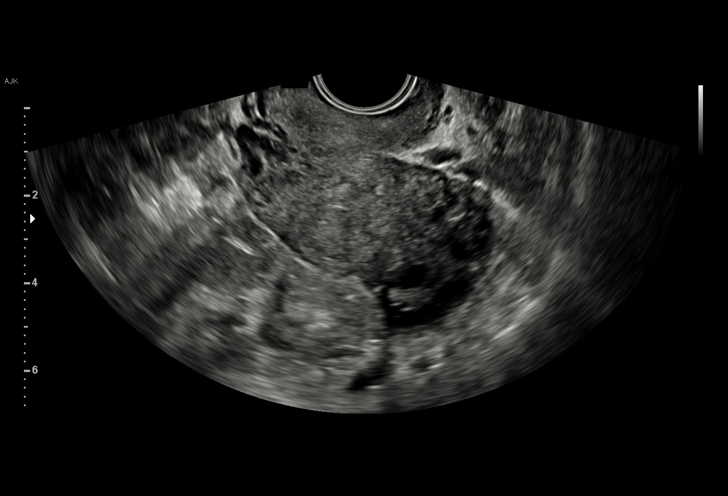
[im 49/78]
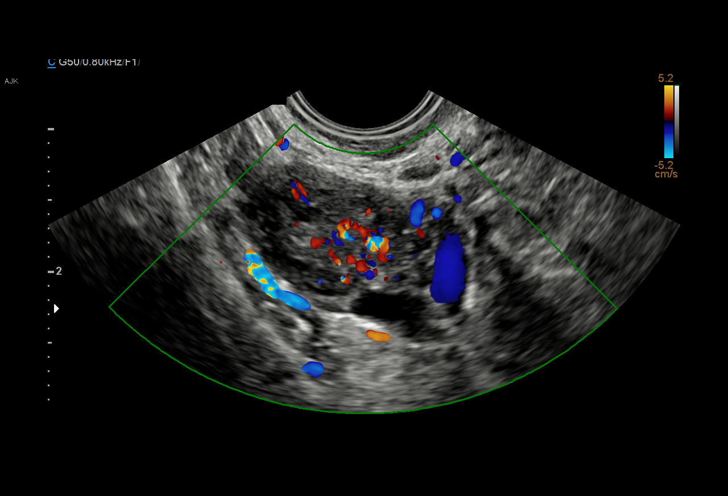
[im 55/78]
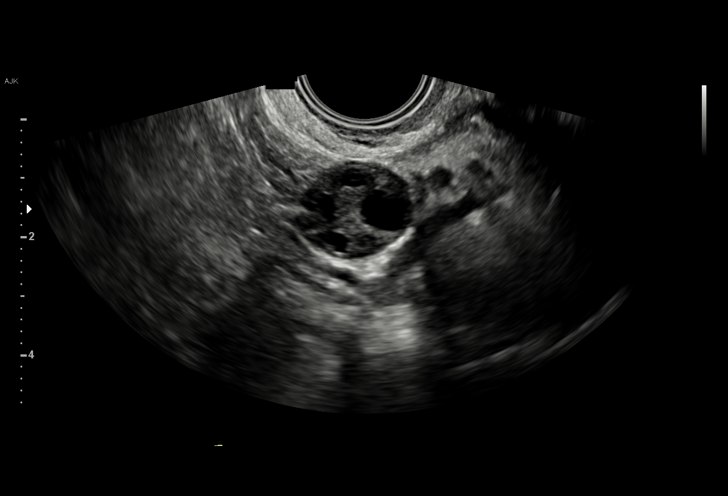
[im 61/78]
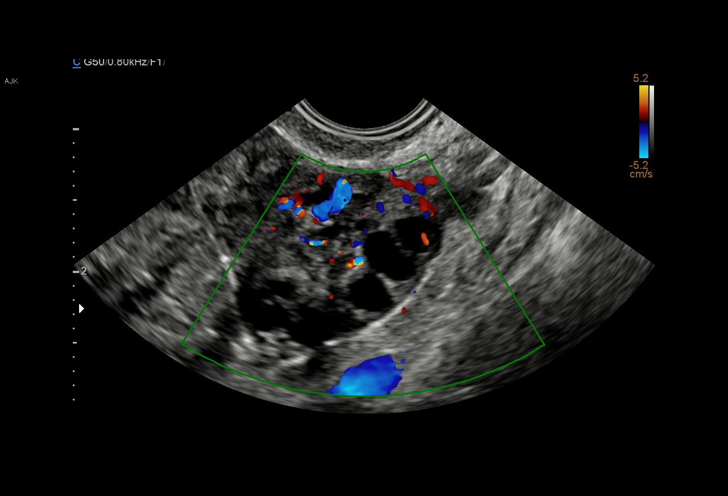
[im 65/78]
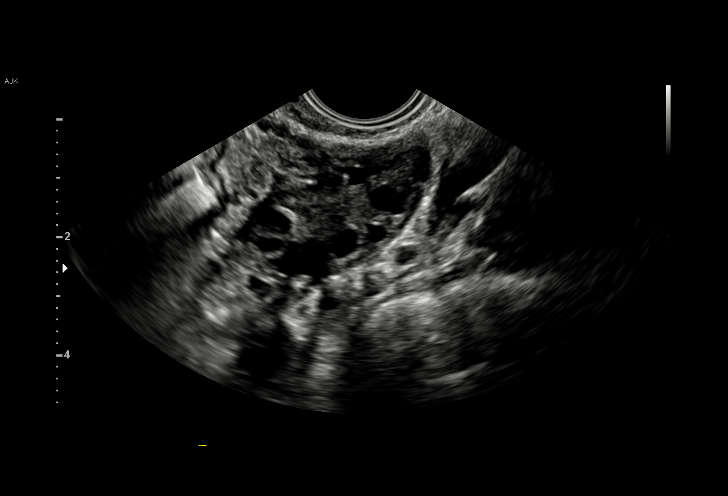
[im 71/78]
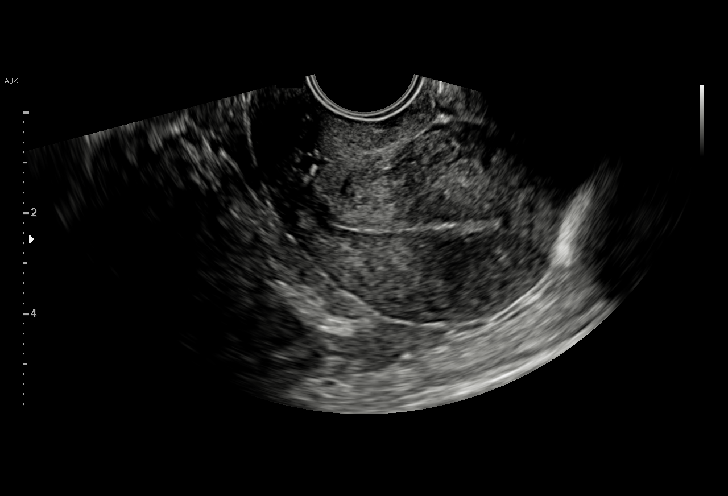
[im 78/78]
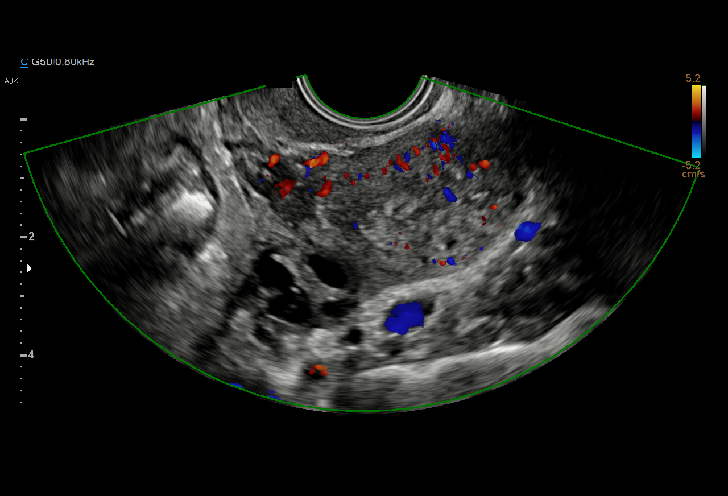

[15 of 25 positions shown; findings below may reference images not displayed]

FINDINGS: Uterus

Measurements: 6.4 x 4 x 4.9 cm and retroverted in appearance.. No
fibroids or other mass visualized.

Endometrium

Thickness: 2.2 mm and homogeneous in appearance. No focal
abnormality visualized.

Right ovary

Measurements: 2.6 x 1.8 x 3 cm. Normal appearance/no adnexal mass.
Small peripheral follicles are seen.

Left ovary

Measurements: 3.8 x 1.7 x 2.8 cm. Normal appearance/no adnexal mass.
Small physiologic sized follicles are present.

Pulsed Doppler evaluation demonstrates normal low-resistance
arterial and venous waveforms in both ovaries.

Other: No free fluid.  No adnexal masses.
IMPRESSION: No ovarian torsion.  No uterine or adnexal mass.

## 2019-09-10 ENCOUNTER — Encounter (HOSPITAL_COMMUNITY): Payer: Self-pay

## 2019-09-10 ENCOUNTER — Ambulatory Visit (HOSPITAL_COMMUNITY)
Admission: EM | Admit: 2019-09-10 | Discharge: 2019-09-10 | Disposition: A | Discharge status: Home / Self Care | Payer: BLUE CROSS/BLUE SHIELD | Attending: Family Medicine | Admitting: Family Medicine

## 2019-09-10 ENCOUNTER — Other Ambulatory Visit: Payer: Self-pay

## 2019-09-10 DIAGNOSIS — Z3202 Encounter for pregnancy test, result negative: Secondary | ICD-10-CM

## 2019-09-10 DIAGNOSIS — N10 Acute tubulo-interstitial nephritis: Secondary | ICD-10-CM | POA: Insufficient documentation

## 2019-09-10 LAB — POCT URINALYSIS DIP (DEVICE)
Bilirubin Urine: NEGATIVE
Glucose, UA: NEGATIVE mg/dL
Ketones, ur: NEGATIVE mg/dL
Nitrite: NEGATIVE
Protein, ur: NEGATIVE mg/dL
Specific Gravity, Urine: <=1.005 (ref 1.005–1.030)
Urobilinogen, UA: 0.2 mg/dL (ref 0.0–1.0)
pH: 6 (ref 5.0–8.0)

## 2019-09-10 LAB — POCT PREGNANCY, URINE: Preg Test, Ur: NEGATIVE

## 2019-09-10 MED ORDER — IBUPROFEN 600 MG PO TABS: 600.0000 mg | ORAL_TABLET | Freq: Four times a day (QID) | ORAL | 0 refills | Status: DC

## 2019-09-10 MED ORDER — LIDOCAINE HCL (PF) 1 % IJ SOLN
INTRAMUSCULAR | Status: AC
  Filled 2019-09-10: qty 4

## 2019-09-10 MED ORDER — CEFTRIAXONE SODIUM 1 G IJ SOLR
INTRAMUSCULAR | Status: AC
  Filled 2019-09-10: qty 10

## 2019-09-10 MED ORDER — SULFAMETHOXAZOLE-TRIMETHOPRIM 800-160 MG PO TABS: 1.0000 | ORAL_TABLET | Freq: Two times a day (BID) | ORAL | 1 refills | Status: DC

## 2019-09-10 MED ORDER — CEFTRIAXONE SODIUM 1 G IJ SOLR
1.0000 g | Freq: Once | INTRAMUSCULAR | Status: AC
  Administered 2019-09-10: 10:00:00 1 g via INTRAMUSCULAR

## 2019-09-10 NOTE — Discharge Instructions (Addendum)
Continue to push fluids Take the antibiotic 2 x  day until finished Take the ibuprofen 3 x  a day with food, for pain Call if not improved in a couple of days

## 2019-09-10 NOTE — ED Triage Notes (Signed)
Pt states she is having burning sensation and pain when urinating and lower back pain x 2 weeks. Pt states having lower abdominal pain on and off x 2 weeks. Pt states she has fever 102.0 F last night and headache.

## 2019-09-10 NOTE — ED Provider Notes (Signed)
[facility]    CSN: [id number] Arrival date & time: 09/10/19  [id number]      History   Chief Complaint Chief Complaint  Patient presents with  . Dysuria  . Back Pain  . Abdominal Pain    HPI Anna Cowan is a 33 y.o. female.   HPI The patient presents with a 2 week history of dysuria and a 3 day history of bilateral flank pain. The flank pain is described as constant 3/10 on the pain scale. She also endorses mild nausea and upper left abdominal pain that began 3 days ago. She has not vomited. Last night, she felt feverish and took her temperature, which was 38.3 C. Denies URI symptoms of cough and congestion.  Her LMP was on 10/16 and was described as normal. She denies any abnormal vaginal discharge and pelvic pain.     Past Medical History:  Diagnosis Date  . Abnormal Pap smear   . Depression    PP depression after del 2014 see at [facility]  . Vaginal Pap smear, abnormal     Patient Active Problem List   Diagnosis Date Noted  . HGSIL (high grade squamous intraepithelial lesion) on Pap smear of cervix 07/11/2012    Past Surgical History:  Procedure Laterality Date  . COLPOSCOPY    . NO PAST SURGERIES      OB History    Gravida  4   Para  4   Term  4   Preterm      AB      Living  4     SAB      TAB      Ectopic      Multiple  0   Live Births  4            Home Medications    Prior to Admission medications   Medication Sig Start Date End Date Taking? Authorizing Provider  ibuprofen (ADVIL) 600 MG tablet Take 1 tablet (600 mg total) by mouth every 6 (six) hours. 09/10/19   Eustace MooreNelson,  Sue, MD  sulfamethoxazole-trimethoprim (BACTRIM DS) 800-160 MG tablet Take 1 tablet by mouth 2 (two) times daily. 09/10/19   Eustace MooreNelson,  Sue, MD    Family History Family History  Problem Relation Age of Onset  . Depression Sister     Social History Social History   Tobacco Use  . Smoking status: Never  Smoker  . Smokeless tobacco: Never Used  Substance Use Topics  . Alcohol use: No  . Drug use: No     Allergies   Patient has no known allergies.   Review of Systems Review of Systems  Constitutional: Positive for fever. Negative for chills.  HENT: Negative for ear pain and sore throat.   Eyes: Negative for pain and visual disturbance.  Respiratory: Negative for cough and shortness of breath.   Cardiovascular: Negative for chest pain and palpitations.  Gastrointestinal: Negative for abdominal pain and vomiting.  Genitourinary: Positive for dysuria and flank pain. Negative for hematuria.  Musculoskeletal: Negative for arthralgias and back pain.  Skin: Negative for color change and rash.  Neurological: Negative for seizures and syncope.  All other systems reviewed and are negative.    Physical Exam Triage Vital Signs ED Triage Vitals  Enc Vitals Group     BP 09/10/19 0821 107/62     Pulse Rate 09/10/19 0821 87     Resp 09/10/19 0821 15     Temp  09/10/19 0821 98.5 F (36.9 C)     Temp Source 09/10/19 0821 Oral     SpO2 09/10/19 0821 99 %     Weight --      Height --      Head Circumference --      Peak Flow --      Pain Score 09/10/19 0820 6     Pain Loc --      Pain Edu? --      Excl. in GC? --    No data found.  Updated Vital Signs BP 107/62 (BP Location: Right Arm)   Pulse 87   Temp 98.5 F (36.9 C) (Oral)   Resp 15   LMP  (Within Weeks)   SpO2 99%   Visual Acuity Right Eye Distance:   Left Eye Distance:   Bilateral Distance:    Right Eye Near:   Left Eye Near:    Bilateral Near:     Physical Exam Constitutional:      General: She is not in acute distress.    Appearance: She is well-developed.  HENT:     Head: Normocephalic and atraumatic.  Eyes:     Conjunctiva/sclera: Conjunctivae normal.     Pupils: Pupils are equal, round, and reactive to light.  Neck:     Musculoskeletal: Normal range of motion.  Cardiovascular:     Rate and Rhythm:  Normal rate and regular rhythm.     Heart sounds: Normal heart sounds.  Pulmonary:     Effort: Pulmonary effort is normal. No respiratory distress.     Breath sounds: Normal breath sounds.  Abdominal:     General: Bowel sounds are normal. There is no distension.     Palpations: Abdomen is soft.     Tenderness: There is abdominal tenderness in the suprapubic area. There is right CVA tenderness and left CVA tenderness.  Musculoskeletal: Normal range of motion.  Skin:    General: Skin is warm and dry.  Neurological:     Mental Status: She is alert.      UC Treatments / Results  Labs (all labs ordered are listed, but only abnormal results are displayed) Labs Reviewed  POCT URINALYSIS DIP (DEVICE) - Abnormal; Notable for the following components:      Result Value   Hgb urine dipstick SMALL (*)    Leukocytes,Ua TRACE (*)    All other components within normal limits  URINE CULTURE  POCT PREGNANCY, URINE    EKG   Radiology No results found.  Procedures Procedures (including critical care time)  Medications Ordered in UC Medications  cefTRIAXone (ROCEPHIN) injection 1 g (1 g Intramuscular Given 09/10/19 0953)  cefTRIAXone (ROCEPHIN) 1 g injection (has no administration in time range)  lidocaine (PF) (XYLOCAINE) 1 % injection (has no administration in time range)    Initial Impression / Assessment and Plan / UC Course  I have reviewed the triage vital signs and the nursing notes.  Pertinent labs & imaging results that were available during my care of the patient were reviewed by me and considered in my medical decision making (see chart for details).    Urine culture is ordered.  Pyelonephritis is treated.  Return as needed Final Clinical Impressions(s) / UC Diagnoses   Final diagnoses:  Acute pyelonephritis     Discharge Instructions     Continue to push fluids Take the antibiotic 2 x  day until finished Take the ibuprofen 3 x  a day with food, for pain Call  if not improved in a couple of days     ED Prescriptions    Medication Sig Dispense Auth. Provider   ibuprofen (ADVIL) 600 MG tablet Take 1 tablet (600 mg total) by mouth every 6 (six) hours. 30 tablet Raylene Everts, MD   sulfamethoxazole-trimethoprim (BACTRIM DS) 800-160 MG tablet Take 1 tablet by mouth 2 (two) times daily. 14 tablet Raylene Everts, MD     PDMP not reviewed this encounter.   Raylene Everts, MD 09/10/19 2117

## 2019-09-12 ENCOUNTER — Telehealth (HOSPITAL_COMMUNITY): Payer: Self-pay | Admitting: Emergency Medicine

## 2019-09-12 LAB — URINE CULTURE: Culture: 40000 — AB

## 2019-09-12 NOTE — Telephone Encounter (Signed)
Urine culture was positive for e coli and was given bactrim  at urgent care visit. Pt contacted and made aware, educated on completing antibiotic and to follow up if symptoms are persistent. Verbalized understanding.    

## 2019-10-24 ENCOUNTER — Ambulatory Visit (INDEPENDENT_AMBULATORY_CARE_PROVIDER_SITE_OTHER): Payer: BLUE CROSS/BLUE SHIELD | Admitting: Primary Care

## 2020-01-21 ENCOUNTER — Emergency Department (HOSPITAL_COMMUNITY): Payer: BLUE CROSS/BLUE SHIELD

## 2020-01-21 ENCOUNTER — Emergency Department (HOSPITAL_COMMUNITY)
Admission: EM | Admit: 2020-01-21 | Discharge: 2020-01-22 | Disposition: A | Discharge status: Home / Self Care | Payer: BLUE CROSS/BLUE SHIELD | Attending: Emergency Medicine | Admitting: Emergency Medicine

## 2020-01-21 ENCOUNTER — Encounter (HOSPITAL_COMMUNITY): Payer: Self-pay

## 2020-01-21 ENCOUNTER — Other Ambulatory Visit: Payer: Self-pay

## 2020-01-21 DIAGNOSIS — Z79899 Other long term (current) drug therapy: Secondary | ICD-10-CM | POA: Diagnosis not present

## 2020-01-21 DIAGNOSIS — M79605 Pain in left leg: Secondary | ICD-10-CM | POA: Diagnosis not present

## 2020-01-21 DIAGNOSIS — R0789 Other chest pain: Secondary | ICD-10-CM

## 2020-01-21 LAB — BASIC METABOLIC PANEL
Anion gap: 10 (ref 5–15)
BUN: 10 mg/dL (ref 6–20)
CO2: 22 mmol/L (ref 22–32)
Calcium: 9.6 mg/dL (ref 8.9–10.3)
Chloride: 104 mmol/L (ref 98–111)
Creatinine, Ser: 0.76 mg/dL (ref 0.44–1.00)
GFR calc Af Amer: >60 mL/min (ref >60)
GFR calc non Af Amer: >60 mL/min (ref >60)
Glucose, Bld: 92 mg/dL (ref 70–99)
Potassium: 3.7 mmol/L (ref 3.5–5.1)
Sodium: 136 mmol/L (ref 135–145)

## 2020-01-21 LAB — CBC
HCT: 38.4 % (ref 36.0–46.0)
Hemoglobin: 13.5 g/dL (ref 12.0–15.0)
MCH: 31.6 pg (ref 26.0–34.0)
MCHC: 35.2 g/dL (ref 30.0–36.0)
MCV: 89.9 fL (ref 80.0–100.0)
Platelets: 190 10*3/uL (ref 150–400)
RBC: 4.27 MIL/uL (ref 3.87–5.11)
RDW: 12.5 % (ref 11.5–15.5)
WBC: 7.8 10*3/uL (ref 4.0–10.5)
nRBC: 0 % (ref 0.0–0.2)

## 2020-01-21 LAB — TROPONIN I (HIGH SENSITIVITY)
Troponin I (High Sensitivity): <2 ng/L (ref <18)
Troponin I (High Sensitivity): <2 ng/L (ref <18)

## 2020-01-21 LAB — I-STAT BETA HCG BLOOD, ED (MC, WL, AP ONLY): I-stat hCG, quantitative: <5 m[IU]/mL (ref <5)

## 2020-01-21 NOTE — ED Triage Notes (Signed)
Pt reports back pain that radiates to her chest that started about 9 days ago, also reports pain down her left arm and left leg. Pt a.o, ambulatory.

## 2020-01-22 LAB — D-DIMER, QUANTITATIVE: D-Dimer, Quant: 0.31 ug/mL-FEU (ref 0.00–0.50)

## 2020-01-22 NOTE — ED Provider Notes (Signed)
TIME SEEN: 1:48 AM  CHIEF COMPLAINT: Chest pain, left leg pain  HPI: Patient is a 34 year old female with no significant past medical history who presents to the emergency department with over a week's worth of intermittent left-sided chest tightness that radiates into the back with associated shortness of breath.  She also has had left anterior thigh pain and feels like her left leg has been swollen.  Currently not having any discomfort in her chest or leg.  No history of PE or DVT.  No history of hypertension, diabetes, hyperlipidemia, tobacco use or history of premature CAD in her family.  States pain is worse with movement.  No fever or cough.  ROS: See HPI Constitutional: no fever  Eyes: no drainage  ENT: no runny nose   Cardiovascular:  chest pain  Resp: no SOB  GI: no vomiting GU: no dysuria Integumentary: no rash  Allergy: no hives  Musculoskeletal: left leg swelling  Neurological: no slurred speech ROS otherwise negative  PAST MEDICAL HISTORY/PAST SURGICAL HISTORY:  Past Medical History:  Diagnosis Date  . Abnormal Pap smear   . Depression    PP depression after del 2014 see at [facility]  . Vaginal Pap smear, abnormal     MEDICATIONS:  Prior to Admission medications   Medication Sig Start Date End Date Taking? Authorizing Provider  ibuprofen (ADVIL) 600 MG tablet Take 1 tablet (600 mg total) by mouth every 6 (six) hours. 09/10/19   Eustace Moore, MD  sulfamethoxazole-trimethoprim (BACTRIM DS) 800-160 MG tablet Take 1 tablet by mouth 2 (two) times daily. 09/10/19   Eustace Moore, MD    ALLERGIES:  No Known Allergies  SOCIAL HISTORY:  Social History   Tobacco Use  . Smoking status: Never Smoker  . Smokeless tobacco: Never Used  Substance Use Topics  . Alcohol use: No    FAMILY HISTORY: Family History  Problem Relation Age of Onset  . Depression Sister     EXAM: BP 112/67 (BP Location: Right Arm)   Pulse 79   Temp  97.9 F (36.6 C) (Oral)   Resp (!) 22   Ht 5\' 2"  (1.575 m)   Wt 59.4 kg   SpO2 100%   BMI 23.96 kg/m  CONSTITUTIONAL: Alert and oriented and responds appropriately to questions. Well-appearing; well-nourished HEAD: Normocephalic EYES: Conjunctivae clear, pupils appear equal, EOM appear intact ENT: normal nose; moist mucous membranes NECK: Supple, normal ROM CARD: RRR; S1 and S2 appreciated; no murmurs, no clicks, no rubs, no gallops CHEST:  Chest wall is nontender to palpation.  No crepitus, ecchymosis, erythema, warmth, rash or other lesions present.   RESP: Normal chest excursion without splinting or tachypnea; breath sounds clear and equal bilaterally; no wheezes, no rhonchi, no rales, no hypoxia or respiratory distress, speaking full sentences ABD/GI: Normal bowel sounds; non-distended; soft, non-tender, no rebound, no guarding, no peritoneal signs, no hepatosplenomegaly BACK:  The back appears normal EXT: Normal ROM in all joints; no deformity noted, no edema; no cyanosis, she reports she has some left calf tenderness on exam but no appreciable swelling, legs appear symmetric, warm and well-perfused SKIN: Normal color for age and race; warm; no rash on exposed skin NEURO: Moves all extremities equally PSYCH: The patient's mood and manner are appropriate.   MEDICAL DECISION MAKING: Patient here with chest pain.  Currently chest pain-free.  EKG shows no ischemic change and she has had 2 negative high-sensitivity troponins.  Chest x-ray is clear.  Doubt  ACS, dissection.  Given she is also complaining of some left leg pain with intermittent swelling that is not appreciated currently, will obtain D-dimer to rule out PE, DVT.  ED PROGRESS: Patient's D-dimer is negative.  She is still having no pain and is hemodynamically stable.  Recommended Tylenol and Motrin for pain and will have her follow-up with her primary care provider.   At this time, I do not feel there is any life-threatening  condition present. I have reviewed, interpreted and discussed all results (EKG, imaging, lab, urine as appropriate) and exam findings with patient/family. I have reviewed nursing notes and appropriate previous records.  I feel the patient is safe to be discharged home without further emergent workup and can continue workup as an outpatient as needed. Discussed usual and customary return precautions. Patient/family verbalize understanding and are comfortable with this plan.  Outpatient follow-up has been provided as needed. All questions have been answered.     EKG Interpretation  Date/Time:  Tuesday January 21 2020 18:14:12 EST Ventricular Rate:  90 PR Interval:  154 QRS Duration: 96 QT Interval:  368 QTC Calculation: 450 R Axis:   -80 Text Interpretation: Normal sinus rhythm Left axis deviation Abnormal ECG No significant change since last tracing Confirmed by Pryor Curia [phone]) on 01/22/2020 1:48:43 AM         Marcinek Liner was evaluated in Emergency Department on 01/22/2020 for the symptoms described in the history of present illness. She was evaluated in the context of the global COVID-19 pandemic, which necessitated consideration that the patient might be at risk for infection with the SARS-CoV-2 virus that causes COVID-19. Institutional protocols and algorithms that pertain to the evaluation of patients at risk for COVID-19 are in a state of rapid change based on information released by regulatory bodies including the CDC and federal and state organizations. These policies and algorithms were followed during the patient's care in the ED.  Patient was seen wearing N95, face shield, gloves.    Jakaiden Fill, Delice Bison, DO 01/22/20 [phone]

## 2020-01-22 NOTE — Discharge Instructions (Addendum)
You may take Tylenol 1000 mg every 6 hours as needed for pain and alternate this with ibuprofen 800 mg every 8 hours as needed for pain.  Please take ibuprofen with food.  Your cardiac labs, EKG, chest x-ray were normal today.  You have had two normal troponins (which rules out acute heart attack) and a negative D-dimer that has ruled out blood clots.  Please follow-up with your primary care physician for further evaluation.

## 2020-03-04 ENCOUNTER — Encounter: Payer: Self-pay | Admitting: *Deleted

## 2020-04-15 ENCOUNTER — Ambulatory Visit (INDEPENDENT_AMBULATORY_CARE_PROVIDER_SITE_OTHER): Payer: BLUE CROSS/BLUE SHIELD | Admitting: Obstetrics & Gynecology

## 2020-04-15 ENCOUNTER — Encounter: Payer: Self-pay | Admitting: Obstetrics & Gynecology

## 2020-04-15 ENCOUNTER — Other Ambulatory Visit (HOSPITAL_COMMUNITY)
Admission: RE | Admit: 2020-04-15 | Discharge: 2020-04-15 | Disposition: A | Discharge status: Home / Self Care | Payer: BLUE CROSS/BLUE SHIELD | Source: Ambulatory Visit | Attending: Obstetrics and Gynecology | Admitting: Obstetrics and Gynecology

## 2020-04-15 ENCOUNTER — Other Ambulatory Visit: Payer: Self-pay

## 2020-04-15 VITALS — BP 113/67 | HR 57 | Wt 128.8 lb

## 2020-04-15 DIAGNOSIS — R87610 Atypical squamous cells of undetermined significance on cytologic smear of cervix (ASC-US): Secondary | ICD-10-CM | POA: Diagnosis not present

## 2020-04-15 DIAGNOSIS — R87810 Cervical high risk human papillomavirus (HPV) DNA test positive: Secondary | ICD-10-CM | POA: Diagnosis present

## 2020-04-15 DIAGNOSIS — Z3202 Encounter for pregnancy test, result negative: Secondary | ICD-10-CM | POA: Diagnosis not present

## 2020-04-15 LAB — POCT PREGNANCY, URINE: Preg Test, Ur: NEGATIVE

## 2020-04-15 NOTE — Progress Notes (Signed)
     GYNECOLOGY CLINIC COLPOSCOPY PROCEDURE NOTE  34 y.o. [id number] here for colposcopy for ASCUS with POSITIVE high risk HPV pap smear on 01/2020. Discussed role for HPV in cervical dysplasia, need for surveillance.  Patient given informed consent, signed copy in the chart, time out was performed.  Placed in lithotomy position. Cervix viewed with speculum and colposcope after application of acetic acid.   Colposcopy adequate? Yes  no visible lesions, no mosaicism and no punctation; corresponding biopsies obtained.  ECC specimen obtained. Ectropion cervix All specimens were labeled and sent to pathology.  Patient was given post procedure instructions.  Will follow up pathology and manage accordingly; patient will be contacted with results and recommendations.  Routine preventative health maintenance measures emphasized.  Pt interested in HPV vaccine.     Raynelle Dick, MD, FACOG Attending Obstetrician & Gynecologist, [facility] for [facility], [facility]

## 2020-04-17 LAB — SURGICAL PATHOLOGY

## 2020-04-20 ENCOUNTER — Telehealth: Payer: Self-pay

## 2020-04-20 NOTE — Telephone Encounter (Addendum)
-----   Message from Anna Chamber, MD sent at 04/20/2020  9:09 AM EDT ----- Negative path, follow up pap in 1 year.   Notified pt results and the need for f/u.    [name], RN

## 2020-07-06 ENCOUNTER — Encounter: Payer: Self-pay | Admitting: *Deleted

## 2020-07-17 ENCOUNTER — Encounter: Payer: Self-pay | Admitting: General Practice

## 2020-12-06 IMAGING — CR DG CHEST 2V
2 series · 2 of 2 positions shown · non-contrast
Comparison: 06/21/2010

CLINICAL DATA: Chest pain radiating to left arm and back.

EXAM:
CHEST - 2 VIEW

[chest pa]
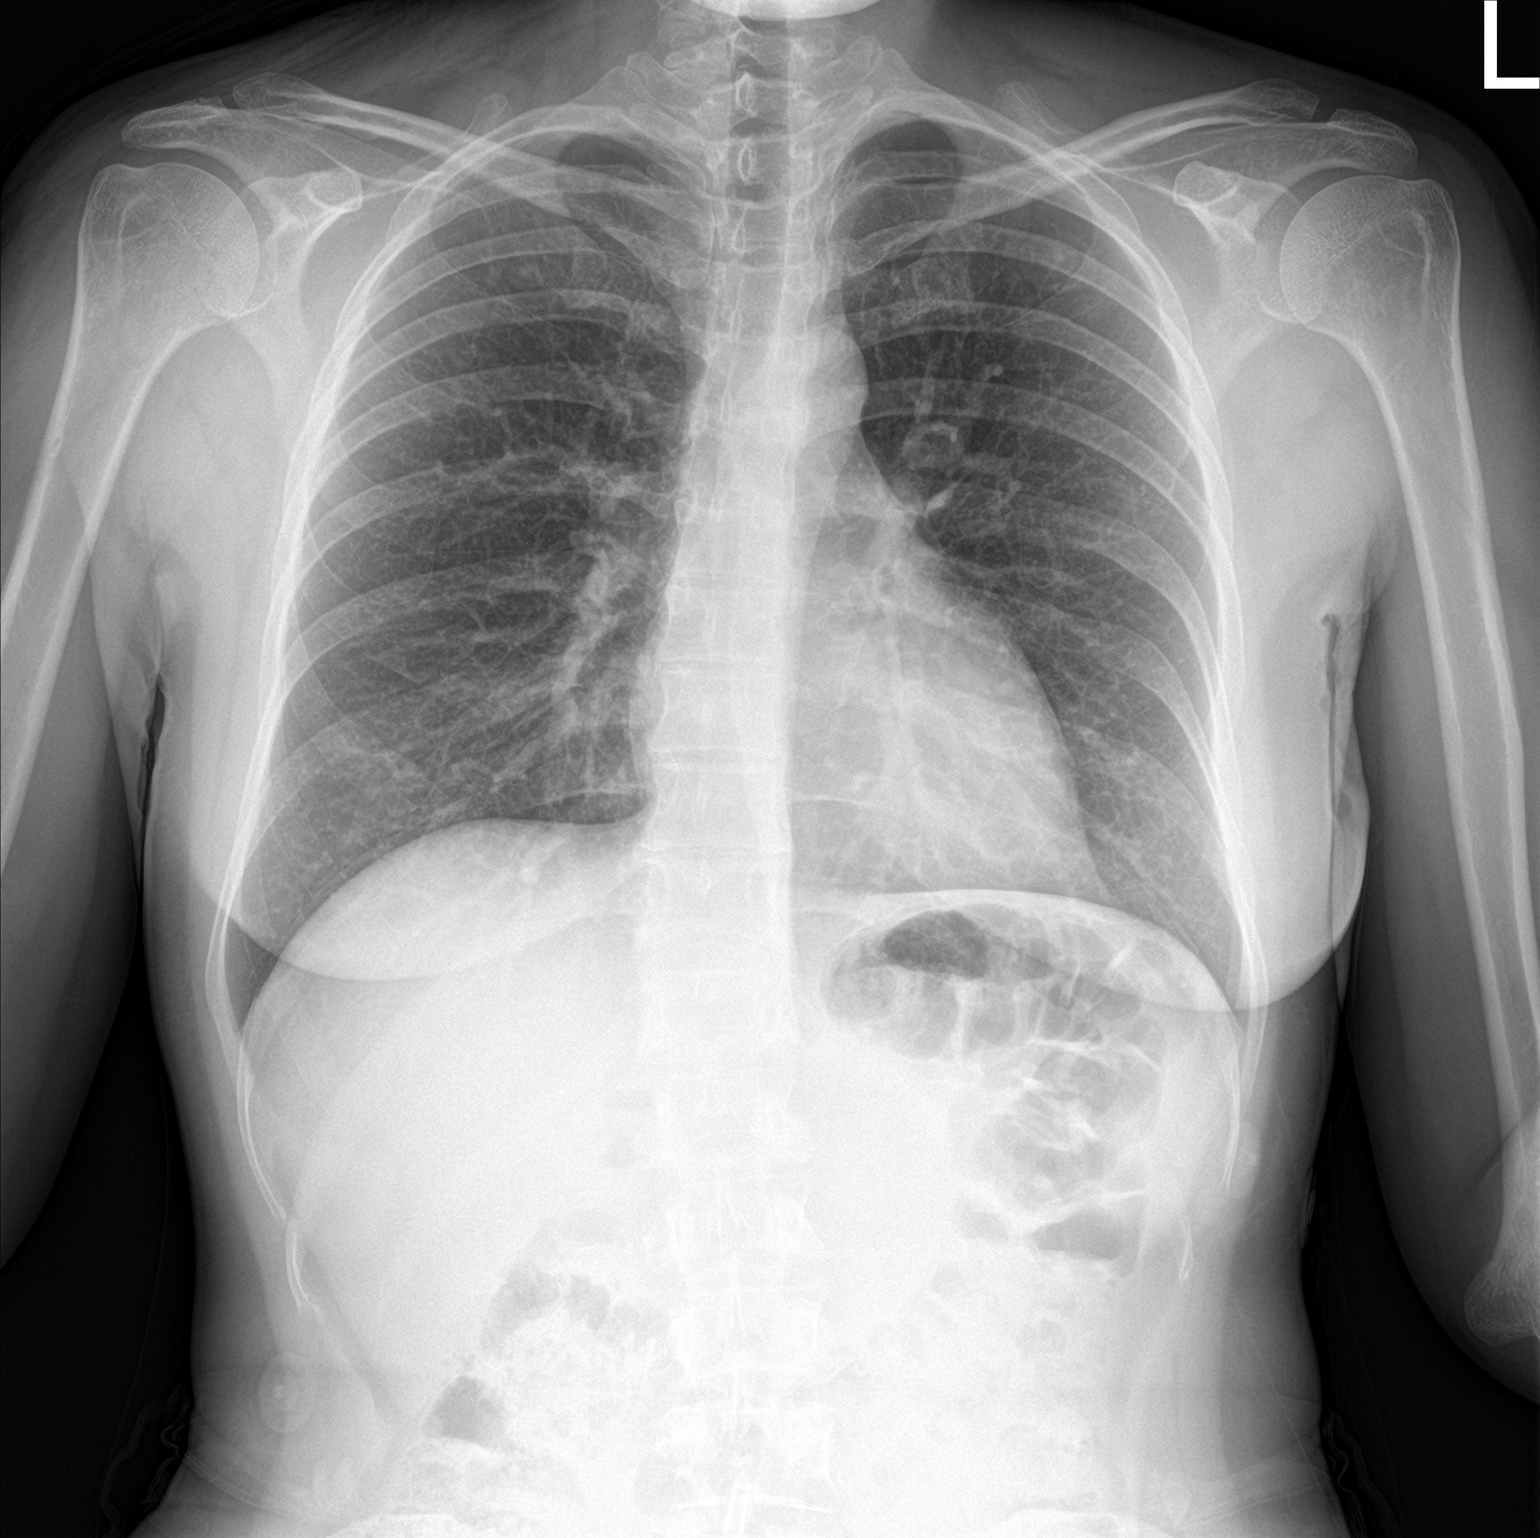

[chest lat]
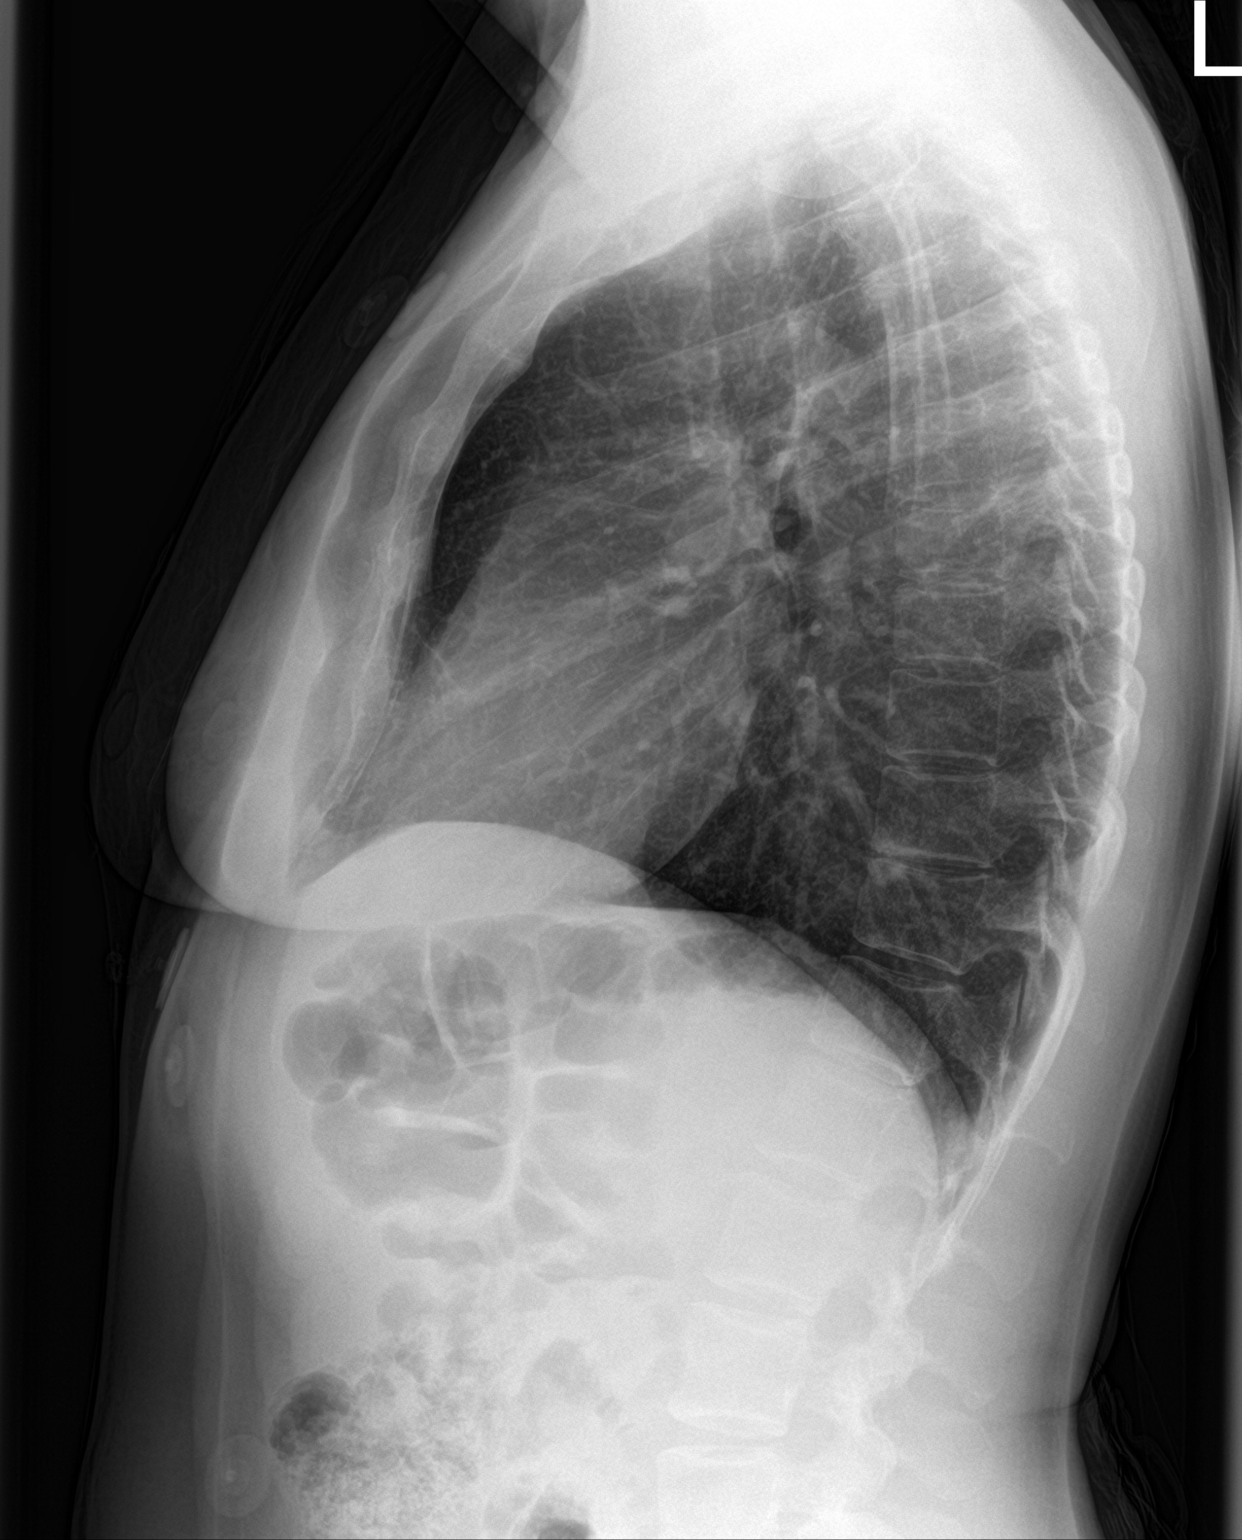

[2 of 2 positions shown; findings below may reference images not displayed]

FINDINGS: The heart size and mediastinal contours are within normal limits.
Both lungs are clear. The visualized skeletal structures are
unremarkable.
IMPRESSION: No active cardiopulmonary disease.

## 2023-03-27 ENCOUNTER — Other Ambulatory Visit: Payer: Self-pay | Admitting: Obstetrics and Gynecology

## 2023-03-27 ENCOUNTER — Ambulatory Visit
Admission: RE | Admit: 2023-03-27 | Discharge: 2023-03-27 | Disposition: A | Discharge status: Home / Self Care | Payer: No Typology Code available for payment source | Source: Ambulatory Visit | Attending: Obstetrics and Gynecology | Admitting: Obstetrics and Gynecology

## 2023-03-27 DIAGNOSIS — R7611 Nonspecific reaction to tuberculin skin test without active tuberculosis: Secondary | ICD-10-CM
